# Patient Record
Sex: Female | Born: 1935 | Race: Black or African American | Hispanic: No | Smoking: Former smoker
Health system: Southern US, Community
[De-identification: ages and names within clinical notes are randomized; demographics above are authoritative.]

## PROBLEM LIST (undated history)

## (undated) DIAGNOSIS — K5792 Diverticulitis of intestine, part unspecified, without perforation or abscess without bleeding: Secondary | ICD-10-CM

## (undated) DIAGNOSIS — G459 Transient cerebral ischemic attack, unspecified: Secondary | ICD-10-CM

## (undated) DIAGNOSIS — C801 Malignant (primary) neoplasm, unspecified: Secondary | ICD-10-CM

## (undated) DIAGNOSIS — I10 Essential (primary) hypertension: Secondary | ICD-10-CM

## (undated) DIAGNOSIS — K56609 Unspecified intestinal obstruction, unspecified as to partial versus complete obstruction: Secondary | ICD-10-CM

## (undated) DIAGNOSIS — H409 Unspecified glaucoma: Secondary | ICD-10-CM

## (undated) HISTORY — PX: GLAUCOMA SURGERY: SHX656

## (undated) HISTORY — PX: ABDOMINAL HYSTERECTOMY: SHX81

---

## 2014-10-13 ENCOUNTER — Emergency Department (HOSPITAL_COMMUNITY): Payer: Medicare Other

## 2014-10-13 ENCOUNTER — Encounter (HOSPITAL_COMMUNITY): Payer: Self-pay | Admitting: Emergency Medicine

## 2014-10-13 ENCOUNTER — Inpatient Hospital Stay (HOSPITAL_COMMUNITY)
Admission: EM | Admit: 2014-10-13 | Discharge: 2014-10-16 | DRG: 389 | Disposition: A | Discharge status: Home Health | Payer: Medicare Other | Attending: Internal Medicine | Admitting: Internal Medicine

## 2014-10-13 ENCOUNTER — Other Ambulatory Visit (HOSPITAL_COMMUNITY): Payer: Self-pay

## 2014-10-13 DIAGNOSIS — Z8249 Family history of ischemic heart disease and other diseases of the circulatory system: Secondary | ICD-10-CM

## 2014-10-13 DIAGNOSIS — E870 Hyperosmolality and hypernatremia: Secondary | ICD-10-CM | POA: Diagnosis not present

## 2014-10-13 DIAGNOSIS — K5669 Other intestinal obstruction: Secondary | ICD-10-CM

## 2014-10-13 DIAGNOSIS — R110 Nausea: Secondary | ICD-10-CM | POA: Diagnosis present

## 2014-10-13 DIAGNOSIS — Z8673 Personal history of transient ischemic attack (TIA), and cerebral infarction without residual deficits: Secondary | ICD-10-CM

## 2014-10-13 DIAGNOSIS — H409 Unspecified glaucoma: Secondary | ICD-10-CM | POA: Diagnosis present

## 2014-10-13 DIAGNOSIS — K5660 Unspecified intestinal obstruction: Secondary | ICD-10-CM | POA: Diagnosis present

## 2014-10-13 DIAGNOSIS — R109 Unspecified abdominal pain: Secondary | ICD-10-CM

## 2014-10-13 DIAGNOSIS — K56609 Unspecified intestinal obstruction, unspecified as to partial versus complete obstruction: Secondary | ICD-10-CM | POA: Diagnosis present

## 2014-10-13 DIAGNOSIS — I10 Essential (primary) hypertension: Secondary | ICD-10-CM | POA: Diagnosis not present

## 2014-10-13 DIAGNOSIS — Z87891 Personal history of nicotine dependence: Secondary | ICD-10-CM

## 2014-10-13 DIAGNOSIS — R112 Nausea with vomiting, unspecified: Secondary | ICD-10-CM

## 2014-10-13 DIAGNOSIS — Z8542 Personal history of malignant neoplasm of other parts of uterus: Secondary | ICD-10-CM

## 2014-10-13 DIAGNOSIS — D72829 Elevated white blood cell count, unspecified: Secondary | ICD-10-CM | POA: Diagnosis present

## 2014-10-13 DIAGNOSIS — Z4659 Encounter for fitting and adjustment of other gastrointestinal appliance and device: Secondary | ICD-10-CM

## 2014-10-13 HISTORY — DX: Malignant (primary) neoplasm, unspecified: C80.1

## 2014-10-13 HISTORY — DX: Unspecified glaucoma: H40.9

## 2014-10-13 HISTORY — DX: Diverticulitis of intestine, part unspecified, without perforation or abscess without bleeding: K57.92

## 2014-10-13 HISTORY — DX: Unspecified intestinal obstruction, unspecified as to partial versus complete obstruction: K56.609

## 2014-10-13 HISTORY — DX: Transient cerebral ischemic attack, unspecified: G45.9

## 2014-10-13 HISTORY — DX: Essential (primary) hypertension: I10

## 2014-10-13 LAB — CBC WITH DIFFERENTIAL/PLATELET
Basophils Absolute: 0 10*3/uL (ref 0.0–0.1)
Basophils Relative: 0 % (ref 0–1)
EOS ABS: 0.1 10*3/uL (ref 0.0–0.7)
Eosinophils Relative: 1 % (ref 0–5)
HEMATOCRIT: 38.4 % (ref 36.0–46.0)
Hemoglobin: 12.5 g/dL (ref 12.0–15.0)
Lymphocytes Relative: 11 % — ABNORMAL LOW (ref 12–46)
Lymphs Abs: 0.7 10*3/uL (ref 0.7–4.0)
MCH: 30.3 pg (ref 26.0–34.0)
MCHC: 32.6 g/dL (ref 30.0–36.0)
MCV: 93 fL (ref 78.0–100.0)
Monocytes Absolute: 0.3 10*3/uL (ref 0.1–1.0)
Monocytes Relative: 5 % (ref 3–12)
NEUTROS ABS: 4.9 10*3/uL (ref 1.7–7.7)
NEUTROS PCT: 83 % — AB (ref 43–77)
Platelets: 183 10*3/uL (ref 150–400)
RBC: 4.13 MIL/uL (ref 3.87–5.11)
RDW: 12.8 % (ref 11.5–15.5)
WBC: 6 10*3/uL (ref 4.0–10.5)

## 2014-10-13 LAB — COMPREHENSIVE METABOLIC PANEL
ALT: 12 U/L (ref 0–35)
AST: 18 U/L (ref 0–37)
Albumin: 3.8 g/dL (ref 3.5–5.2)
Alkaline Phosphatase: 64 U/L (ref 39–117)
Anion gap: 9 (ref 5–15)
BILIRUBIN TOTAL: 0.7 mg/dL (ref 0.3–1.2)
BUN: 21 mg/dL (ref 6–23)
CALCIUM: 9.3 mg/dL (ref 8.4–10.5)
CO2: 31 mmol/L (ref 19–32)
CREATININE: 0.95 mg/dL (ref 0.50–1.10)
Chloride: 97 mmol/L (ref 96–112)
GFR, EST AFRICAN AMERICAN: 58 mL/min — AB (ref >90)
GFR, EST NON AFRICAN AMERICAN: 50 mL/min — AB (ref >90)
Glucose, Bld: 121 mg/dL — ABNORMAL HIGH (ref 70–99)
POTASSIUM: 3.5 mmol/L (ref 3.5–5.1)
Sodium: 137 mmol/L (ref 135–145)
TOTAL PROTEIN: 7.1 g/dL (ref 6.0–8.3)

## 2014-10-13 MED ORDER — LORAZEPAM 2 MG/ML IJ SOLN
0.5000 mg | Freq: Once | INTRAMUSCULAR | Status: AC
  Administered 2014-10-13: 0.5 mg via INTRAVENOUS
  Filled 2014-10-13: qty 1

## 2014-10-13 MED ORDER — ONDANSETRON HCL 4 MG/2ML IJ SOLN
4.0000 mg | Freq: Once | INTRAMUSCULAR | Status: AC
  Administered 2014-10-13: 4 mg via INTRAVENOUS
  Filled 2014-10-13: qty 2

## 2014-10-13 MED ORDER — FENTANYL CITRATE 0.05 MG/ML IJ SOLN
12.5000 ug | Freq: Once | INTRAMUSCULAR | Status: AC
  Administered 2014-10-13: 12.5 ug via INTRAVENOUS
  Filled 2014-10-13: qty 2

## 2014-10-13 MED ORDER — IOHEXOL 300 MG/ML  SOLN
25.0000 mL | Freq: Once | INTRAMUSCULAR | Status: AC | PRN
  Administered 2014-10-13: 25 mL via ORAL

## 2014-10-13 MED ORDER — SODIUM CHLORIDE 0.9 % IV SOLN
INTRAVENOUS | Status: AC
  Administered 2014-10-13: 17:00:00 via INTRAVENOUS

## 2014-10-13 MED ORDER — SODIUM CHLORIDE 0.9 % IV SOLN
INTRAVENOUS | Status: DC
  Administered 2014-10-14 – 2014-10-15 (×2): via INTRAVENOUS

## 2014-10-13 MED ORDER — MORPHINE SULFATE 2 MG/ML IJ SOLN: 1.0000 mg | INTRAMUSCULAR | Status: DC | PRN

## 2014-10-13 MED ORDER — FENTANYL CITRATE 0.05 MG/ML IJ SOLN
25.0000 ug | Freq: Once | INTRAMUSCULAR | Status: AC
  Administered 2014-10-13: 25 ug via INTRAVENOUS
  Filled 2014-10-13: qty 2

## 2014-10-13 MED ORDER — BISACODYL 10 MG RE SUPP: 10.0000 mg | Freq: Once | RECTAL | Status: DC

## 2014-10-13 MED ORDER — SODIUM CHLORIDE 0.9 % IV BOLUS (SEPSIS)
500.0000 mL | Freq: Once | INTRAVENOUS | Status: AC
  Administered 2014-10-13: 500 mL via INTRAVENOUS

## 2014-10-13 MED ORDER — ONDANSETRON HCL 4 MG PO TABS: 4.0000 mg | ORAL_TABLET | Freq: Four times a day (QID) | ORAL | Status: DC | PRN

## 2014-10-13 MED ORDER — SENNOSIDES-DOCUSATE SODIUM 8.6-50 MG PO TABS: 1.0000 | ORAL_TABLET | Freq: Every evening | ORAL | Status: DC | PRN

## 2014-10-13 MED ORDER — ONDANSETRON HCL 4 MG/2ML IJ SOLN
4.0000 mg | Freq: Four times a day (QID) | INTRAMUSCULAR | Status: DC | PRN
  Administered 2014-10-13: 4 mg via INTRAVENOUS
  Filled 2014-10-13: qty 2

## 2014-10-13 MED ORDER — HEPARIN SODIUM (PORCINE) 5000 UNIT/ML IJ SOLN
5000.0000 [IU] | Freq: Three times a day (TID) | INTRAMUSCULAR | Status: DC
  Administered 2014-10-13 – 2014-10-16 (×8): 5000 [IU] via SUBCUTANEOUS
  Filled 2014-10-13 (×7): qty 1

## 2014-10-13 MED ORDER — SODIUM CHLORIDE 0.9 % IJ SOLN
INTRAMUSCULAR | Status: AC
  Filled 2014-10-13: qty 250

## 2014-10-13 MED ORDER — SODIUM CHLORIDE 0.9 % IJ SOLN
INTRAMUSCULAR | Status: AC
  Filled 2014-10-13: qty 30

## 2014-10-13 MED ORDER — IOHEXOL 300 MG/ML  SOLN
100.0000 mL | Freq: Once | INTRAMUSCULAR | Status: AC | PRN
  Administered 2014-10-13: 100 mL via INTRAVENOUS

## 2014-10-13 MED ORDER — HYDRALAZINE HCL 20 MG/ML IJ SOLN: 10.0000 mg | Freq: Four times a day (QID) | INTRAMUSCULAR | Status: DC | PRN

## 2014-10-13 NOTE — ED Notes (Signed)
Patient requesting something for pain. EDP made aware-order to be given.

## 2014-10-13 NOTE — ED Notes (Signed)
Pt c/o abdominal pain and cough that began last night. Pt localizes pain to upper mid abd.

## 2014-10-13 NOTE — ED Notes (Signed)
Pt still in CT

## 2014-10-13 NOTE — ED Notes (Signed)
Pt unable to void at this time. 

## 2014-10-13 NOTE — ED Provider Notes (Signed)
CSN: [id number]     Arrival date & time 10/13/14  0714 History  This chart was scribed for Monetta* by Edison Simon, ED Scribe. This patient was seen in room A314/A314-01 and the patient's care was started at 7:40 AM.    Chief Complaint  Patient presents with  . Abdominal Pain   The history is provided by the patient and a relative. No language interpreter was used.    HPI Comments: Katie Benitez is a 81 y.o. female who presents to the Emergency Department complaining of nausea with onset last night. She states she has tried to vomit but cannot. She reports associated subjective fever. Relative notes she had a small liquid stool at 0300 this morning, which is not typical for her. She states she still has her gallbladder and appendix. She denies sick contacts. She denies dysuria, difficulty urinating, or constipation.  Past Medical History  Diagnosis Date  . Diverticulitis   . Hypertension   . TIA (transient ischemic attack)   . Glaucoma   . Cancer     uterine   Past Surgical History  Procedure Laterality Date  . Abdominal hysterectomy    . Glaucoma surgery     Family History  Problem Relation Age of Onset  . Hypertension Father   . Hypertension Sister   . Coronary artery disease Other    History  Substance Use Topics  . Smoking status: Former Research scientist (life sciences)  . Smokeless tobacco: Never Used  . Alcohol Use: No   OB History    Gravida Para Term Preterm AB TAB SAB Ectopic Multiple Living   6 6 6             Review of Systems  Constitutional: Positive for fever.  Respiratory: Positive for cough.   Gastrointestinal: Positive for nausea and diarrhea. Negative for vomiting and constipation.  Genitourinary: Negative for dysuria and difficulty urinating.  All other systems reviewed and are negative.     Allergies  Review of patient's allergies indicates no known allergies.  Home Medications   Prior to Admission medications   Medication Sig Start Date End Date  Taking? Authorizing Provider  amLODipine (NORVASC) 5 MG tablet Take 5 mg by mouth daily.   Yes Historical Provider, MD  atenolol (TENORMIN) 100 MG tablet Take 100 mg by mouth daily.   Yes Historical Provider, MD  Calcium Carb-Cholecalciferol 600-200 MG-UNIT TABS Take 1 tablet by mouth 2 (two) times daily.   Yes Historical Provider, MD  cloNIDine (CATAPRES) 0.1 MG tablet Take 0.1 mg by mouth 2 (two) times daily.   Yes Historical Provider, MD  docusate sodium (COLACE) 100 MG capsule Take 200 mg by mouth at bedtime.   Yes Historical Provider, MD  hydrochlorothiazide (HYDRODIURIL) 25 MG tablet Take 25 mg by mouth daily.   Yes Historical Provider, MD  quinapril (ACCUPRIL) 40 MG tablet Take 40 mg by mouth daily.   Yes Historical Provider, MD  tamsulosin (FLOMAX) 0.4 MG CAPS capsule Take 0.4 mg by mouth daily.   Yes Historical Provider, MD   BP 151/63 mmHg  Pulse 78  Temp(Src) 99.9 F (37.7 C) (Oral)  Resp 18  Ht 5\' 2"  (1.575 m)  Wt 143 lb 12.8 oz (65.227 kg)  BMI 26.29 kg/m2  SpO2 91% Physical Exam Physical Exam  Nursing note and vitals reviewed. Constitutional: She is oriented to person, place, and time. She appears well-developed and well-nourished. No distress.  HENT:  Head: Normocephalic and atraumatic.  Eyes: Pupils are equal, round, and  reactive to light.  Neck: Normal range of motion.  Cardiovascular: Normal rate and intact distal pulses.   Pulmonary/Chest: No respiratory distress.  Chest clear to auscultation  Abdominal: Normal appearance. She exhibits no distension.  No tenderness to palpation.  No rebound or guarding.   Musculoskeletal: Normal range of motion.  Neurological: She is alert and oriented to person, place, and time. No cranial nerve deficit.  Skin: Skin is warm and dry. No rash noted.  Psychiatric: She has a normal mood and affect. Her behavior is normal.   ED Course  Procedures (including critical care time)  DIAGNOSTIC STUDIES: Oxygen Saturation is 93% on room  air, adequate by my interpretation.    COORDINATION OF CARE: 7:44 AM Discussed treatment plan with patient at beside, the patient agrees with the plan and has no further questions at this time.   Labs Review Labs Reviewed  CBC WITH DIFFERENTIAL/PLATELET - Abnormal; Notable for the following:    Neutrophils Relative % 83 (*)    Lymphocytes Relative 11 (*)    All other components within normal limits  COMPREHENSIVE METABOLIC PANEL - Abnormal; Notable for the following:    Glucose, Bld 121 (*)    GFR calc non Af Amer 50 (*)    GFR calc Af Amer 58 (*)    All other components within normal limits  BASIC METABOLIC PANEL  CBC    Imaging Review Ct Abdomen Pelvis W Contrast  10/13/2014   CLINICAL DATA:  Nausea with onset last night. Abdominal pain, unspecified abdominal location.  EXAM: CT ABDOMEN AND PELVIS WITH CONTRAST  TECHNIQUE: Multidetector CT imaging of the abdomen and pelvis was performed using the standard protocol following bolus administration of intravenous contrast.  CONTRAST:  54mL OMNIPAQUE IOHEXOL 300 MG/ML SOLN, 166mL OMNIPAQUE IOHEXOL 300 MG/ML SOLN  COMPARISON:  Abdominal series [phone]  FINDINGS: Prominent lung markings at the lung bases suggestive for atelectasis or scarring. Negative for pleural effusions. No evidence for free intraperitoneal air.  Low-attenuation fluid in the subcarinal region could be related to the pericardial recess. Atherosclerotic calcifications in the coronary arteries. No significant pericardial effusion.  There is a trace amount of perihepatic fluid near the anterior dome. Gallbladder is mildly distended. Portal venous system is patent. Normal appearance of the spleen and pancreas. There is trace fluid along the left hemidiaphragm and adjacent to the spleen. Indeterminate 1.6 cm right adrenal nodule. Normal appearance the left adrenal gland. Mildly dense round structure along the right kidney upper pole (measures 1.5 cm) is indeterminate based on the  Hounsfield units (31). There are additional small hypodense structures throughout the right kidney that probably represent cysts. Probable small cysts in the left kidney. No evidence for hydronephrosis.  Uterus appears to be surgically absent. Moderate amount of fluid in the urinary bladder. Trace free fluid in the pelvis. Small amount of free fluid in the left paracolic gutter. There is free fluid and mesenteric edema in the right lower abdomen. Dilated loops of small bowel in the mid and right abdomen. The terminal ileum and appendix appear to be normal. Dilated small bowel loops measuring up to 2.7 cm.  No significant lymphadenopathy. Diffuse atherosclerotic calcifications involving the arterial structures. The abdominal aorta at the hiatus measures 3.1 cm. Large amount of mural thrombus along the right side of the infrarenal abdominal aorta. The infrarenal abdominal aorta is ectatic measuring up to 2.8 cm.  Severe multilevel degenerative disease in the lumbar spine. Anterolisthesis at L4-L5.  IMPRESSION: Dilated loops of small bowel  with abdominal ascites. The distal small bowel is decompressed. Findings are suggestive for a small bowel obstruction. A transition point is not clearly identified.  Several low density structures in the kidneys are suggestive for cysts. Largest in the right kidney upper pole is indeterminate based on the Hounsfield units.  1.6 cm right adrenal nodule is indeterminate. Favor a benign etiology based on the size.  Diffuse atherosclerotic disease involving the aorta and coronary arteries.  These results were called by telephone at the time of interpretation on 10/13/2014 at 11:54 am to Dr. Leonard Schwartz , who verbally acknowledged these results.   Electronically Signed   By: Markus Daft M.D.   On: 10/13/2014 11:55   Dg Abd Acute W/chest  10/13/2014   CLINICAL DATA:  Nausea and vomiting  EXAM: ACUTE ABDOMEN SERIES (ABDOMEN 2 VIEW & CHEST 1 VIEW)  COMPARISON:  None  FINDINGS: PA chest:  No edema or consolidation. Heart is upper normal in size with pulmonary vascularity within normal limits. No adenopathy. There is degenerative change in the thoracic spine.  Supine and upright abdomen: There is moderate stool in the colon. There is no bowel dilatation or air-fluid level suggesting obstruction. No free air. There are surgical clips in the you lower abdomen and pelvis. There is degenerative change in the lumbar spine.  IMPRESSION: Overall bowel gas pattern unremarkable. No demonstrable obstruction or free air. No edema or consolidation in the lungs.   Electronically Signed   By: Lowella Grip III M.D.   On: 10/13/2014 08:38     EKG Interpretation   Date/Time:  Saturday October 13 2014 08:35:20 EDT Ventricular Rate:  52 PR Interval:  193 QRS Duration: 139 QT Interval:  461 QTC Calculation: 429 R Axis:   10 Text Interpretation:  Sinus rhythm Multiple premature complexes, vent   Probable left atrial enlargement Left bundle branch block Confirmed by  Cleburn Maiolo  MD, Chayil Gantt ([id number]) on 10/13/2014 8:38:47 AM     Surgery consulted, recommedned NG tube.  Will admit to medicine. MDM   Final diagnoses:  Nausea and vomiting  Abdominal pain  Small bowel obstruction    I personally performed the services described in this documentation, which was scribed in my presence. The recorded information has been reviewed and considered.   Leonard Schwartz, MD 10/14/14 [phone]

## 2014-10-13 NOTE — ED Notes (Signed)
Attempted to call report, floor to call back

## 2014-10-13 NOTE — ED Notes (Signed)
Dr. Arnoldo Morale called to consult for surgery.

## 2014-10-13 NOTE — H&P (Signed)
[facility]          History and Physical    PCP:   ROBERTSON, ANTHONY T, PA-C   Chief Complaint:  Abdominal pain, nausea, vomiting  HPI: Patient is a 81 year old woman with history significant for hypertension. She states that around 3:00 this morning she started having severe abdominal pain and vomited 3 times yellow color, no blood. She describes abdominal pain as generalized. She came into the hospital where CT scan was performed and showed a small bowel obstruction without clear transition point, however distal small bowel appear to be decompressed. Surgery, Dr. Arnoldo Morale was consulted by EDP with recommendations for NG tube placement and medical admission with hopes of conservative treatment only. Patient has been admitted for further evaluation and management.  Allergies:  No Known Allergies    Past Medical History  Diagnosis Date  . Diverticulitis   . Hypertension   . TIA (transient ischemic attack)   . Glaucoma   . Cancer     uterine    Past Surgical History  Procedure Laterality Date  . Abdominal hysterectomy    . Glaucoma surgery      Prior to Admission medications   Medication Sig Start Date End Date Taking? Authorizing Provider  amLODipine (NORVASC) 5 MG tablet Take 5 mg by mouth daily.   Yes Historical Provider, MD  atenolol (TENORMIN) 100 MG tablet Take 100 mg by mouth daily.   Yes Historical Provider, MD  Calcium Carb-Cholecalciferol 600-200 MG-UNIT TABS Take 1 tablet by mouth 2 (two) times daily.   Yes Historical Provider, MD  cloNIDine (CATAPRES) 0.1 MG tablet Take 0.1 mg by mouth 2 (two) times daily.   Yes Historical Provider, MD  docusate sodium (COLACE) 100 MG capsule Take 200 mg by mouth at bedtime.   Yes Historical Provider, MD  hydrochlorothiazide (HYDRODIURIL) 25 MG tablet Take 25 mg by mouth daily.   Yes Historical Provider, MD  quinapril (ACCUPRIL) 40 MG tablet Take 40 mg by mouth daily.   Yes Historical Provider, MD    tamsulosin (FLOMAX) 0.4 MG CAPS capsule Take 0.4 mg by mouth daily.   Yes Historical Provider, MD    Social History:  reports that she has quit smoking. She has never used smokeless tobacco. She reports that she does not drink alcohol or use illicit drugs.  Family History  Problem Relation Age of Onset  . Hypertension Father   . Hypertension Sister   . Coronary artery disease Other     Review of Systems:  Constitutional: Denies fever, chills, diaphoresis, appetite change and fatigue.  HEENT: Denies photophobia, eye pain, redness, hearing loss, ear pain, congestion, sore throat, rhinorrhea, sneezing, mouth sores, trouble swallowing, neck pain, neck stiffness and tinnitus.   Respiratory: Denies SOB, DOE, cough, chest tightness,  and wheezing.   Cardiovascular: Denies chest pain, palpitations and leg swelling.  Gastrointestinal: Denies  blood in stool Genitourinary: Denies dysuria, urgency, frequency, hematuria, flank pain and difficulty urinating.  Endocrine: Denies: hot or cold intolerance, sweats, changes in hair or nails, polyuria, polydipsia. Musculoskeletal: Denies myalgias, back pain, joint swelling, arthralgias and gait problem.  Skin: Denies pallor, rash and wound.  Neurological: Denies dizziness, seizures, syncope, weakness, light-headedness, numbness and headaches.  Hematological: Denies adenopathy. Easy bruising, personal or family bleeding history  Psychiatric/Behavioral: Denies suicidal ideation, mood changes, confusion, nervousness, sleep disturbance and agitation   Physical Exam: Blood pressure 158/48, pulse 68, temperature 99.2 F (37.3  C), temperature source Oral, resp. rate 18, height '5\' 2"'  (1.575 m), weight 66.407 kg (146 lb 6.4 oz), SpO2 97 %. General: Alert, awake, HEENT: Normocephalic, atraumatic, pupils equal round and reactive to light, extraocular movements intact, NG in place, poor dentition, dry mucous membranes. Neck: Supple, no JVD, no lymphadenopathy, no  bruits, no goiter. Cardiovascular: Regular rate and rhythm, no murmurs, rubs or gallops. Lungs: Clear to auscultation bilaterally. Abdomen: Soft, diffusely tender to palpation, nondistended, hypoactive bowel sounds, no masses or organomegaly noted. Sign extremities: No clubbing, cyanosis or edema, positive pulses. Neurologic: Appears grossly intact and nonfocal, I have not ambulated her.  Labs on Admission:  Results for orders placed or performed during the hospital encounter of 10/13/14 (from the past 48 hour(s))  CBC with Differential/Platelet     Status: Abnormal   Collection Time: 10/13/14  8:00 AM  Result Value Ref Range   WBC 6.0 4.0 - 10.5 K/uL   RBC 4.13 3.87 - 5.11 MIL/uL   Hemoglobin 12.5 12.0 - 15.0 g/dL   HCT 38.4 36.0 - 46.0 %   MCV 93.0 78.0 - 100.0 fL   MCH 30.3 26.0 - 34.0 pg   MCHC 32.6 30.0 - 36.0 g/dL   RDW 12.8 11.5 - 15.5 %   Platelets 183 150 - 400 K/uL   Neutrophils Relative % 83 (H) 43 - 77 %   Neutro Abs 4.9 1.7 - 7.7 K/uL   Lymphocytes Relative 11 (L) 12 - 46 %   Lymphs Abs 0.7 0.7 - 4.0 K/uL   Monocytes Relative 5 3 - 12 %   Monocytes Absolute 0.3 0.1 - 1.0 K/uL   Eosinophils Relative 1 0 - 5 %   Eosinophils Absolute 0.1 0.0 - 0.7 K/uL   Basophils Relative 0 0 - 1 %   Basophils Absolute 0.0 0.0 - 0.1 K/uL  Comprehensive metabolic panel     Status: Abnormal   Collection Time: 10/13/14  8:00 AM  Result Value Ref Range   Sodium 137 135 - 145 mmol/L   Potassium 3.5 3.5 - 5.1 mmol/L   Chloride 97 96 - 112 mmol/L   CO2 31 19 - 32 mmol/L   Glucose, Bld 121 (H) 70 - 99 mg/dL   BUN 21 6 - 23 mg/dL   Creatinine, Ser 0.95 0.50 - 1.10 mg/dL   Calcium 9.3 8.4 - 10.5 mg/dL   Total Protein 7.1 6.0 - 8.3 g/dL   Albumin 3.8 3.5 - 5.2 g/dL   AST 18 0 - 37 U/L   ALT 12 0 - 35 U/L   Alkaline Phosphatase 64 39 - 117 U/L   Total Bilirubin 0.7 0.3 - 1.2 mg/dL   GFR calc non Af Amer 50 (L) >90 mL/min   GFR calc Af Amer 58 (L) >90 mL/min    Comment: (NOTE) The eGFR  has been calculated using the CKD EPI equation. This calculation has not been validated in all clinical situations. eGFR's persistently <90 mL/min signify possible Chronic Kidney Disease.    Anion gap 9 5 - 15    Radiological Exams on Admission: Ct Abdomen Pelvis W Contrast  10/13/2014   CLINICAL DATA:  Nausea with onset last night. Abdominal pain, unspecified abdominal location.  EXAM: CT ABDOMEN AND PELVIS WITH CONTRAST  TECHNIQUE: Multidetector CT imaging of the abdomen and pelvis was performed using the standard protocol following bolus administration of intravenous contrast.  CONTRAST:  75m OMNIPAQUE IOHEXOL 300 MG/ML SOLN, 1018mOMNIPAQUE IOHEXOL 300 MG/ML SOLN  COMPARISON:  Abdominal series [id number]  FINDINGS: Prominent lung markings at the lung bases suggestive for atelectasis or scarring. Negative for pleural effusions. No evidence for free intraperitoneal air.  Low-attenuation fluid in the subcarinal region could be related to the pericardial recess. Atherosclerotic calcifications in the coronary arteries. No significant pericardial effusion.  There is a trace amount of perihepatic fluid near the anterior dome. Gallbladder is mildly distended. Portal venous system is patent. Normal appearance of the spleen and pancreas. There is trace fluid along the left hemidiaphragm and adjacent to the spleen. Indeterminate 1.6 cm right adrenal nodule. Normal appearance the left adrenal gland. Mildly dense round structure along the right kidney upper pole (measures 1.5 cm) is indeterminate based on the Hounsfield units (31). There are additional small hypodense structures throughout the right kidney that probably represent cysts. Probable small cysts in the left kidney. No evidence for hydronephrosis.  Uterus appears to be surgically absent. Moderate amount of fluid in the urinary bladder. Trace free fluid in the pelvis. Small amount of free fluid in the left paracolic gutter. There is free fluid and mesenteric  edema in the right lower abdomen. Dilated loops of small bowel in the mid and right abdomen. The terminal ileum and appendix appear to be normal. Dilated small bowel loops measuring up to 2.7 cm.  No significant lymphadenopathy. Diffuse atherosclerotic calcifications involving the arterial structures. The abdominal aorta at the hiatus measures 3.1 cm. Large amount of mural thrombus along the right side of the infrarenal abdominal aorta. The infrarenal abdominal aorta is ectatic measuring up to 2.8 cm.  Severe multilevel degenerative disease in the lumbar spine. Anterolisthesis at L4-L5.  IMPRESSION: Dilated loops of small bowel with abdominal ascites. The distal small bowel is decompressed. Findings are suggestive for a small bowel obstruction. A transition point is not clearly identified.  Several low density structures in the kidneys are suggestive for cysts. Largest in the right kidney upper pole is indeterminate based on the Hounsfield units.  1.6 cm right adrenal nodule is indeterminate. Favor a benign etiology based on the size.  Diffuse atherosclerotic disease involving the aorta and coronary arteries.  These results were called by telephone at the time of interpretation on 10/13/2014 at 11:54 am to Dr. Leonard Schwartz , who verbally acknowledged these results.   Electronically Signed   By: Markus Daft M.D.   On: 10/13/2014 11:55   Dg Abd Acute W/chest  10/13/2014   CLINICAL DATA:  Nausea and vomiting  EXAM: ACUTE ABDOMEN SERIES (ABDOMEN 2 VIEW & CHEST 1 VIEW)  COMPARISON:  None  FINDINGS: PA chest: No edema or consolidation. Heart is upper normal in size with pulmonary vascularity within normal limits. No adenopathy. There is degenerative change in the thoracic spine.  Supine and upright abdomen: There is moderate stool in the colon. There is no bowel dilatation or air-fluid level suggesting obstruction. No free air. There are surgical clips in the you lower abdomen and pelvis. There is degenerative change in  the lumbar spine.  IMPRESSION: Overall bowel gas pattern unremarkable. No demonstrable obstruction or free air. No edema or consolidation in the lungs.   Electronically Signed   By: Lowella Grip III M.D.   On: 10/13/2014 08:38    Assessment/Plan Principal Problem:   Small bowel obstruction Active Problems:   HTN (hypertension)   SBO (small bowel obstruction)    Small bowel obstruction -Continue NG to intermittent suction. -Nothing by mouth status. -Recheck abdominal film in the morning.- -Surgery to see patient in  the morning, hope to resolve small bowel obstruction with conservative measures.  Hypertension -Hold all by mouth meds given nothing by mouth state. -When necessary hydralazine as needed.  DVT prophylaxis -Subcutaneous heparin  CODE STATUS -Full code as discussed with patient and daughter Ralph Leyden at bedside.   Time Spent on Admission: 85 minutes  HERNANDEZ Benitez,Katie [facility] Pager: [phone] 10/13/2014, 5:16 PM

## 2014-10-14 ENCOUNTER — Inpatient Hospital Stay (HOSPITAL_COMMUNITY): Payer: Medicare Other

## 2014-10-14 LAB — CBC
HCT: 39.8 % (ref 36.0–46.0)
HEMOGLOBIN: 12.8 g/dL (ref 12.0–15.0)
MCH: 30 pg (ref 26.0–34.0)
MCHC: 32.2 g/dL (ref 30.0–36.0)
MCV: 93.2 fL (ref 78.0–100.0)
PLATELETS: 187 10*3/uL (ref 150–400)
RBC: 4.27 MIL/uL (ref 3.87–5.11)
RDW: 13.2 % (ref 11.5–15.5)
WBC: 11.5 10*3/uL — AB (ref 4.0–10.5)

## 2014-10-14 LAB — BASIC METABOLIC PANEL
Anion gap: 8 (ref 5–15)
BUN: 24 mg/dL — AB (ref 6–23)
CO2: 33 mmol/L — AB (ref 19–32)
Calcium: 8.4 mg/dL (ref 8.4–10.5)
Chloride: 102 mmol/L (ref 96–112)
Creatinine, Ser: 0.96 mg/dL (ref 0.50–1.10)
GFR calc Af Amer: 57 mL/min — ABNORMAL LOW (ref >90)
GFR, EST NON AFRICAN AMERICAN: 49 mL/min — AB (ref >90)
Glucose, Bld: 122 mg/dL — ABNORMAL HIGH (ref 70–99)
Potassium: 3.5 mmol/L (ref 3.5–5.1)
Sodium: 143 mmol/L (ref 135–145)

## 2014-10-14 NOTE — Progress Notes (Signed)
[facility] PROGRESS NOTE  Jeanne Diefendorf OZH:[id number] DOB: Mar 11, 1919 DOA: 10/13/2014 PCP: Bronson Curb, PA-C  Assessment/Plan: Partial SBO -Continue NG decompression. -Abd film today shows contrast passing into the colon. -Appreciate surgical input.  HTN -BP meds on hold given NPO state.  Code Status: Full Code Family Communication: daughter  Disposition Plan: To be determined   Consultants:  Surgery   Antibiotics:  None   Subjective: Still with mild abdominal pain  Objective: Filed Vitals:   10/13/14 1602 10/13/14 1611 10/13/14 2253 10/14/14 0551  BP: 158/48  136/49 151/63  Pulse: 68  74 78  Temp: 99.2 F (37.3 C)  99 F (37.2 C) 99.9 F (37.7 C)  TempSrc: Oral  Oral Oral  Resp: 18  18 18   Height:  5\' 2"  (1.575 m)    Weight:  66.407 kg (146 lb 6.4 oz)  65.227 kg (143 lb 12.8 oz)  SpO2: 97%  96% 91%    Intake/Output Summary (Last 24 hours) at 10/14/14 1345 Last data filed at 10/14/14 0600  Gross per 24 hour  Intake      0 ml  Output    800 ml  Net   -800 ml   Filed Weights   10/13/14 0726 10/13/14 1611 10/14/14 0551  Weight: 65.772 kg (145 lb) 66.407 kg (146 lb 6.4 oz) 65.227 kg (143 lb 12.8 oz)    Exam:   General:  AA Ox3  Cardiovascular: RRR  Respiratory: CTA B  Abdomen: S/NT/ND/+BS  Extremities: no C/C/E   Neurologic:  Non-focal  Data Reviewed: Basic Metabolic Panel:  Recent Labs Lab 10/13/14 0800 10/14/14 0654  NA 137 143  K 3.5 3.5  CL 97 102  CO2 31 33*  GLUCOSE 121* 122*  BUN 21 24*  CREATININE 0.95 0.96  CALCIUM 9.3 8.4   Liver Function Tests:  Recent Labs Lab 10/13/14 0800  AST 18  ALT 12  ALKPHOS 64  BILITOT 0.7  PROT 7.1  ALBUMIN 3.8   No results for input(s): LIPASE, AMYLASE in the last 168 hours. No results for input(s): AMMONIA in the last 168 hours. CBC:  Recent Labs Lab 10/13/14 0800 10/14/14 0654  WBC 6.0 11.5*  NEUTROABS 4.9  --   HGB 12.5 12.8  HCT 38.4 39.8    MCV 93.0 93.2  PLT 183 187   Cardiac Enzymes: No results for input(s): CKTOTAL, CKMB, CKMBINDEX, TROPONINI in the last 168 hours. BNP (last 3 results) No results for input(s): BNP in the last 8760 hours.  ProBNP (last 3 results) No results for input(s): PROBNP in the last 8760 hours.  CBG: No results for input(s): GLUCAP in the last 168 hours.  No results found for this or any previous visit (from the past 240 hour(s)).   Studies: Dg Abd 1 View  10/14/2014   CLINICAL DATA:  Small bowel obstruction with nausea and weakness  EXAM: ABDOMEN - 1 VIEW  COMPARISON:  CT abdomen and pelvis October 13, 2014  FINDINGS: Nasogastric tube tip and side port are in the stomach. There remain loops of mildly dilated small bowel in the mid abdomen. Contrast is seen in the colon. No free air is seen on this supine examination. There is contrast within the urinary bladder. There is extensive degenerative type change throughout the lumbar spine.  IMPRESSION: Nasogastric tube tip and side port in stomach. Loops of dilated small bowel are noted. Contrast does reach colon. A degree of incomplete small bowel obstruction may well remain.  Electronically Signed   By: Lowella Grip III M.D.   On: 10/14/2014 09:22   Ct Abdomen Pelvis W Contrast  10/13/2014   CLINICAL DATA:  Nausea with onset last night. Abdominal pain, unspecified abdominal location.  EXAM: CT ABDOMEN AND PELVIS WITH CONTRAST  TECHNIQUE: Multidetector CT imaging of the abdomen and pelvis was performed using the standard protocol following bolus administration of intravenous contrast.  CONTRAST:  66mL OMNIPAQUE IOHEXOL 300 MG/ML SOLN, 185mL OMNIPAQUE IOHEXOL 300 MG/ML SOLN  COMPARISON:  Abdominal series [id number]  FINDINGS: Prominent lung markings at the lung bases suggestive for atelectasis or scarring. Negative for pleural effusions. No evidence for free intraperitoneal air.  Low-attenuation fluid in the subcarinal region could be related to the  pericardial recess. Atherosclerotic calcifications in the coronary arteries. No significant pericardial effusion.  There is a trace amount of perihepatic fluid near the anterior dome. Gallbladder is mildly distended. Portal venous system is patent. Normal appearance of the spleen and pancreas. There is trace fluid along the left hemidiaphragm and adjacent to the spleen. Indeterminate 1.6 cm right adrenal nodule. Normal appearance the left adrenal gland. Mildly dense round structure along the right kidney upper pole (measures 1.5 cm) is indeterminate based on the Hounsfield units (31). There are additional small hypodense structures throughout the right kidney that probably represent cysts. Probable small cysts in the left kidney. No evidence for hydronephrosis.  Uterus appears to be surgically absent. Moderate amount of fluid in the urinary bladder. Trace free fluid in the pelvis. Small amount of free fluid in the left paracolic gutter. There is free fluid and mesenteric edema in the right lower abdomen. Dilated loops of small bowel in the mid and right abdomen. The terminal ileum and appendix appear to be normal. Dilated small bowel loops measuring up to 2.7 cm.  No significant lymphadenopathy. Diffuse atherosclerotic calcifications involving the arterial structures. The abdominal aorta at the hiatus measures 3.1 cm. Large amount of mural thrombus along the right side of the infrarenal abdominal aorta. The infrarenal abdominal aorta is ectatic measuring up to 2.8 cm.  Severe multilevel degenerative disease in the lumbar spine. Anterolisthesis at L4-L5.  IMPRESSION: Dilated loops of small bowel with abdominal ascites. The distal small bowel is decompressed. Findings are suggestive for a small bowel obstruction. A transition point is not clearly identified.  Several low density structures in the kidneys are suggestive for cysts. Largest in the right kidney upper pole is indeterminate based on the Hounsfield units.   1.6 cm right adrenal nodule is indeterminate. Favor a benign etiology based on the size.  Diffuse atherosclerotic disease involving the aorta and coronary arteries.  These results were called by telephone at the time of interpretation on 10/13/2014 at 11:54 am to Dr. Leonard Schwartz , who verbally acknowledged these results.   Electronically Signed   By: Markus Daft M.D.   On: 10/13/2014 11:55   Dg Chest Port 1 View  10/14/2014   CLINICAL DATA:  Nausea and weakness.  Small bowel obstruction.  EXAM: PORTABLE CHEST - 1 VIEW  COMPARISON:  October 13, 2014  FINDINGS: Nasogastric tube tip and side port are in the stomach. There is new airspace consolidation in the right lung base. There is mild atelectasis in the left base. Lungs elsewhere clear. Heart is slightly prominent with pulmonary vascularity within normal limits. No adenopathy.  IMPRESSION: New patchy infiltrate right base. Mild left base atelectasis. No change in cardiac silhouette. Nasogastric tube tip and side port in stomach.  Electronically Signed   By: Lowella Grip III M.D.   On: 10/14/2014 07:39   Dg Abd Acute W/chest  10/13/2014   CLINICAL DATA:  Nausea and vomiting  EXAM: ACUTE ABDOMEN SERIES (ABDOMEN 2 VIEW & CHEST 1 VIEW)  COMPARISON:  None  FINDINGS: PA chest: No edema or consolidation. Heart is upper normal in size with pulmonary vascularity within normal limits. No adenopathy. There is degenerative change in the thoracic spine.  Supine and upright abdomen: There is moderate stool in the colon. There is no bowel dilatation or air-fluid level suggesting obstruction. No free air. There are surgical clips in the you lower abdomen and pelvis. There is degenerative change in the lumbar spine.  IMPRESSION: Overall bowel gas pattern unremarkable. No demonstrable obstruction or free air. No edema or consolidation in the lungs.   Electronically Signed   By: Lowella Grip III M.D.   On: 10/13/2014 08:38    Scheduled Meds: . heparin  5,000 Units  Subcutaneous 3 times per day   Continuous Infusions: . sodium chloride      Principal Problem:   Small bowel obstruction Active Problems:   HTN (hypertension)   SBO (small bowel obstruction)    Time spent: 25 minutes. Greater than 50% of this time was spent in direct contact with the patient coordinating care.    Lelon Frohlich  [facility] Pager [phone]  If 7PM-7AM, please contact night-coverage at [email or web address], password [facility] 10/14/2014, 1:45 PM  LOS: 1 day

## 2014-10-14 NOTE — Consult Note (Signed)
Reason for Consult: Small bowel obstruction Referring Physician: Hospitalist  Jamiee Milholland is an 81 y.o. female.  HPI: Patient is a 81 year old black female who presented to emergency room with worsening nausea and vomiting. CT scan of the abdomen revealed dilated small bowel with some decompression of the bowel distally. No transition zone was seen. There was some free fluid within the abdomen. Patient is not the best historian. She has had a hysterectomy in the remote past. She states she feels better.  Past Medical History  Diagnosis Date  . Diverticulitis   . Hypertension   . TIA (transient ischemic attack)   . Glaucoma   . Cancer     uterine    Past Surgical History  Procedure Laterality Date  . Abdominal hysterectomy    . Glaucoma surgery      Family History  Problem Relation Age of Onset  . Hypertension Father   . Hypertension Sister   . Coronary artery disease Other     Social History:  reports that she has quit smoking. She has never used smokeless tobacco. She reports that she does not drink alcohol or use illicit drugs.  Allergies: No Known Allergies  Medications: I have reviewed the patient's current medications.  Results for orders placed or performed during the hospital encounter of 10/13/14 (from the past 48 hour(s))  CBC with Differential/Platelet     Status: Abnormal   Collection Time: 10/13/14  8:00 AM  Result Value Ref Range   WBC 6.0 4.0 - 10.5 K/uL   RBC 4.13 3.87 - 5.11 MIL/uL   Hemoglobin 12.5 12.0 - 15.0 g/dL   HCT 38.4 36.0 - 46.0 %   MCV 93.0 78.0 - 100.0 fL   MCH 30.3 26.0 - 34.0 pg   MCHC 32.6 30.0 - 36.0 g/dL   RDW 12.8 11.5 - 15.5 %   Platelets 183 150 - 400 K/uL   Neutrophils Relative % 83 (H) 43 - 77 %   Neutro Abs 4.9 1.7 - 7.7 K/uL   Lymphocytes Relative 11 (L) 12 - 46 %   Lymphs Abs 0.7 0.7 - 4.0 K/uL   Monocytes Relative 5 3 - 12 %   Monocytes Absolute 0.3 0.1 - 1.0 K/uL   Eosinophils Relative 1 0 - 5 %   Eosinophils  Absolute 0.1 0.0 - 0.7 K/uL   Basophils Relative 0 0 - 1 %   Basophils Absolute 0.0 0.0 - 0.1 K/uL  Comprehensive metabolic panel     Status: Abnormal   Collection Time: 10/13/14  8:00 AM  Result Value Ref Range   Sodium 137 135 - 145 mmol/L   Potassium 3.5 3.5 - 5.1 mmol/L   Chloride 97 96 - 112 mmol/L   CO2 31 19 - 32 mmol/L   Glucose, Bld 121 (H) 70 - 99 mg/dL   BUN 21 6 - 23 mg/dL   Creatinine, Ser 0.95 0.50 - 1.10 mg/dL   Calcium 9.3 8.4 - 10.5 mg/dL   Total Protein 7.1 6.0 - 8.3 g/dL   Albumin 3.8 3.5 - 5.2 g/dL   AST 18 0 - 37 U/L   ALT 12 0 - 35 U/L   Alkaline Phosphatase 64 39 - 117 U/L   Total Bilirubin 0.7 0.3 - 1.2 mg/dL   GFR calc non Af Amer 50 (L) >90 mL/min   GFR calc Af Amer 58 (L) >90 mL/min    Comment: (NOTE) The eGFR has been calculated using the CKD EPI equation. This calculation has not  been validated in all clinical situations. eGFR's persistently <90 mL/min signify possible Chronic Kidney Disease.    Anion gap 9 5 - 15  Basic metabolic panel     Status: Abnormal   Collection Time: 10/14/14  6:54 AM  Result Value Ref Range   Sodium 143 135 - 145 mmol/L   Potassium 3.5 3.5 - 5.1 mmol/L   Chloride 102 96 - 112 mmol/L   CO2 33 (H) 19 - 32 mmol/L   Glucose, Bld 122 (H) 70 - 99 mg/dL   BUN 24 (H) 6 - 23 mg/dL   Creatinine, Ser 0.96 0.50 - 1.10 mg/dL   Calcium 8.4 8.4 - 10.5 mg/dL   GFR calc non Af Amer 49 (L) >90 mL/min   GFR calc Af Amer 57 (L) >90 mL/min    Comment: (NOTE) The eGFR has been calculated using the CKD EPI equation. This calculation has not been validated in all clinical situations. eGFR's persistently <90 mL/min signify possible Chronic Kidney Disease.    Anion gap 8 5 - 15  CBC     Status: Abnormal   Collection Time: 10/14/14  6:54 AM  Result Value Ref Range   WBC 11.5 (H) 4.0 - 10.5 K/uL   RBC 4.27 3.87 - 5.11 MIL/uL   Hemoglobin 12.8 12.0 - 15.0 g/dL   HCT 39.8 36.0 - 46.0 %   MCV 93.2 78.0 - 100.0 fL   MCH 30.0 26.0 - 34.0  pg   MCHC 32.2 30.0 - 36.0 g/dL   RDW 13.2 11.5 - 15.5 %   Platelets 187 150 - 400 K/uL    Dg Abd 1 View  10/14/2014   CLINICAL DATA:  Small bowel obstruction with nausea and weakness  EXAM: ABDOMEN - 1 VIEW  COMPARISON:  CT abdomen and pelvis October 13, 2014  FINDINGS: Nasogastric tube tip and side port are in the stomach. There remain loops of mildly dilated small bowel in the mid abdomen. Contrast is seen in the colon. No free air is seen on this supine examination. There is contrast within the urinary bladder. There is extensive degenerative type change throughout the lumbar spine.  IMPRESSION: Nasogastric tube tip and side port in stomach. Loops of dilated small bowel are noted. Contrast does reach colon. A degree of incomplete small bowel obstruction may well remain.   Electronically Signed   By: Lowella Grip III M.D.   On: 10/14/2014 09:22   Ct Abdomen Pelvis W Contrast  10/13/2014   CLINICAL DATA:  Nausea with onset last night. Abdominal pain, unspecified abdominal location.  EXAM: CT ABDOMEN AND PELVIS WITH CONTRAST  TECHNIQUE: Multidetector CT imaging of the abdomen and pelvis was performed using the standard protocol following bolus administration of intravenous contrast.  CONTRAST:  8m OMNIPAQUE IOHEXOL 300 MG/ML SOLN, 103mOMNIPAQUE IOHEXOL 300 MG/ML SOLN  COMPARISON:  Abdominal series [id number]: Prominent lung markings at the lung bases suggestive for atelectasis or scarring. Negative for pleural effusions. No evidence for free intraperitoneal air.  Low-attenuation fluid in the subcarinal region could be related to the pericardial recess. Atherosclerotic calcifications in the coronary arteries. No significant pericardial effusion.  There is a trace amount of perihepatic fluid near the anterior dome. Gallbladder is mildly distended. Portal venous system is patent. Normal appearance of the spleen and pancreas. There is trace fluid along the left hemidiaphragm and adjacent to the  spleen. Indeterminate 1.6 cm right adrenal nodule. Normal appearance the left adrenal gland. Mildly dense round structure along the  right kidney upper pole (measures 1.5 cm) is indeterminate based on the Hounsfield units (31). There are additional small hypodense structures throughout the right kidney that probably represent cysts. Probable small cysts in the left kidney. No evidence for hydronephrosis.  Uterus appears to be surgically absent. Moderate amount of fluid in the urinary bladder. Trace free fluid in the pelvis. Small amount of free fluid in the left paracolic gutter. There is free fluid and mesenteric edema in the right lower abdomen. Dilated loops of small bowel in the mid and right abdomen. The terminal ileum and appendix appear to be normal. Dilated small bowel loops measuring up to 2.7 cm.  No significant lymphadenopathy. Diffuse atherosclerotic calcifications involving the arterial structures. The abdominal aorta at the hiatus measures 3.1 cm. Large amount of mural thrombus along the right side of the infrarenal abdominal aorta. The infrarenal abdominal aorta is ectatic measuring up to 2.8 cm.  Severe multilevel degenerative disease in the lumbar spine. Anterolisthesis at L4-L5.  IMPRESSION: Dilated loops of small bowel with abdominal ascites. The distal small bowel is decompressed. Findings are suggestive for a small bowel obstruction. A transition point is not clearly identified.  Several low density structures in the kidneys are suggestive for cysts. Largest in the right kidney upper pole is indeterminate based on the Hounsfield units.  1.6 cm right adrenal nodule is indeterminate. Favor a benign etiology based on the size.  Diffuse atherosclerotic disease involving the aorta and coronary arteries.  These results were called by telephone at the time of interpretation on 10/13/2014 at 11:54 am to Dr. Leonard Schwartz , who verbally acknowledged these results.   Electronically Signed   By: Markus Daft  M.D.   On: 10/13/2014 11:55   Dg Chest Port 1 View  10/14/2014   CLINICAL DATA:  Nausea and weakness.  Small bowel obstruction.  EXAM: PORTABLE CHEST - 1 VIEW  COMPARISON:  October 13, 2014  FINDINGS: Nasogastric tube tip and side port are in the stomach. There is new airspace consolidation in the right lung base. There is mild atelectasis in the left base. Lungs elsewhere clear. Heart is slightly prominent with pulmonary vascularity within normal limits. No adenopathy.  IMPRESSION: New patchy infiltrate right base. Mild left base atelectasis. No change in cardiac silhouette. Nasogastric tube tip and side port in stomach.   Electronically Signed   By: Lowella Grip III M.D.   On: 10/14/2014 07:39   Dg Abd Acute W/chest  10/13/2014   CLINICAL DATA:  Nausea and vomiting  EXAM: ACUTE ABDOMEN SERIES (ABDOMEN 2 VIEW & CHEST 1 VIEW)  COMPARISON:  None  FINDINGS: PA chest: No edema or consolidation. Heart is upper normal in size with pulmonary vascularity within normal limits. No adenopathy. There is degenerative change in the thoracic spine.  Supine and upright abdomen: There is moderate stool in the colon. There is no bowel dilatation or air-fluid level suggesting obstruction. No free air. There are surgical clips in the you lower abdomen and pelvis. There is degenerative change in the lumbar spine.  IMPRESSION: Overall bowel gas pattern unremarkable. No demonstrable obstruction or free air. No edema or consolidation in the lungs.   Electronically Signed   By: Lowella Grip III M.D.   On: 10/13/2014 08:38    ROS: See chart Blood pressure 151/63, pulse 78, temperature 99.9 F (37.7 C), temperature source Oral, resp. rate 18, height _0  (1.575 m), weight 65.227 kg (143 lb 12.8 oz), SpO2 91 %. Physical Exam: Pleasant black  female in no acute distress. NG tube is in place. Abdomen soft, nontender, nondistended. No hepatosplenomegaly, masses, or hernias identified. No rigidity is  noted.  Assessment/Plan: Impression: Partial small bowel obstruction, improved Plan: Would continue NG tube decompression for now. This was discussed with the family that was present. No need for acute surgical intervention at this time. Will follow with you.  Renan Danese A 10/14/2014, 10:21 AM

## 2014-10-14 NOTE — Progress Notes (Signed)
NG tube removed by patient. Reinserted size 12 fr NG tube, placement verified by two nurses. Order placed for portable chest x-ray to verify placement. Tolerate well by patient.

## 2014-10-15 ENCOUNTER — Encounter (HOSPITAL_COMMUNITY): Payer: Self-pay | Admitting: Internal Medicine

## 2014-10-15 DIAGNOSIS — D72829 Elevated white blood cell count, unspecified: Secondary | ICD-10-CM | POA: Diagnosis not present

## 2014-10-15 LAB — URINE MICROSCOPIC-ADD ON

## 2014-10-15 LAB — URINALYSIS, ROUTINE W REFLEX MICROSCOPIC
Glucose, UA: NEGATIVE mg/dL
HGB URINE DIPSTICK: NEGATIVE
KETONES UR: 15 mg/dL — AB
LEUKOCYTES UA: NEGATIVE
Nitrite: NEGATIVE
Protein, ur: 30 mg/dL — AB
Specific Gravity, Urine: >1.03 — ABNORMAL HIGH (ref 1.005–1.030)
Urobilinogen, UA: 0.2 mg/dL (ref 0.0–1.0)
pH: 6 (ref 5.0–8.0)

## 2014-10-15 MED ORDER — DIPHENHYDRAMINE HCL 12.5 MG/5ML PO ELIX
12.5000 mg | ORAL_SOLUTION | Freq: Once | ORAL | Status: AC
  Administered 2014-10-15: 12.5 mg via ORAL
  Filled 2014-10-15: qty 5

## 2014-10-15 NOTE — Progress Notes (Signed)
Patient and daughter requested medication to help patient relax for sleep.  Benadryl elixir order by hospitalist and given.

## 2014-10-15 NOTE — Progress Notes (Signed)
Subjective: Patient has passed flatus. No abdominal pain noted.  Objective: Vital signs in last 24 hours: Temp:  [98.2 F (36.8 C)-99.4 F (37.4 C)] 98.4 F (36.9 C) (03/28 0616) Pulse Rate:  [56-81] 56 (03/28 0616) Resp:  [18-20] 18 (03/28 0616) BP: (146-183)/(64-81) 146/73 mmHg (03/28 0616) SpO2:  [88 %-97 %] 92 % (03/28 0616) Last BM Date: 10/13/14  Intake/Output from previous day: 03/27 0701 - 03/28 0700 In: 1711 [I.V.:1711] Out: 600 [Urine:500; Emesis/NG output:100] Intake/Output this shift: Total I/O In: -  Out: 250 [Urine:150; Emesis/NG output:100]  General appearance: alert, cooperative and appears stated age GI: soft, non-tender; bowel sounds normal; no masses,  no organomegaly  Lab Results:   Recent Labs  10/13/14 0800 10/14/14 0654  WBC 6.0 11.5*  HGB 12.5 12.8  HCT 38.4 39.8  PLT 183 187   BMET  Recent Labs  10/13/14 0800 10/14/14 0654  NA 137 143  K 3.5 3.5  CL 97 102  CO2 31 33*  GLUCOSE 121* 122*  BUN 21 24*  CREATININE 0.95 0.96  CALCIUM 9.3 8.4   PT/INR No results for input(s): LABPROT, INR in the last 72 hours.  Studies/Results: Dg Abd 1 View  10/14/2014   CLINICAL DATA:  Small bowel obstruction with nausea and weakness  EXAM: ABDOMEN - 1 VIEW  COMPARISON:  CT abdomen and pelvis October 13, 2014  FINDINGS: Nasogastric tube tip and side port are in the stomach. There remain loops of mildly dilated small bowel in the mid abdomen. Contrast is seen in the colon. No free air is seen on this supine examination. There is contrast within the urinary bladder. There is extensive degenerative type change throughout the lumbar spine.  IMPRESSION: Nasogastric tube tip and side port in stomach. Loops of dilated small bowel are noted. Contrast does reach colon. A degree of incomplete small bowel obstruction may well remain.   Electronically Signed   By: Lowella Grip III M.D.   On: 10/14/2014 09:22   Dg Chest Port 1 View  10/14/2014   CLINICAL  DATA:  Nasogastric tube replacement  EXAM: PORTABLE CHEST - 1 VIEW  COMPARISON:  Study obtained earlier in the day  FINDINGS: Nasogastric tube tip and side port are in the stomach. There is patchy airspace consolidation in the right base. There is minimal atelectasis in the left base. Lungs elsewhere clear. Heart is prominent but stable. Pulmonary vascularity is normal. No adenopathy.  IMPRESSION: Nasogastric tube tip and side port in stomach. Patchy infiltrate right base. Slight atelectasis left base. Heart prominent but stable.   Electronically Signed   By: Lowella Grip III M.D.   On: 10/14/2014 15:59   Dg Chest Port 1 View  10/14/2014   CLINICAL DATA:  Nausea and weakness.  Small bowel obstruction.  EXAM: PORTABLE CHEST - 1 VIEW  COMPARISON:  October 13, 2014  FINDINGS: Nasogastric tube tip and side port are in the stomach. There is new airspace consolidation in the right lung base. There is mild atelectasis in the left base. Lungs elsewhere clear. Heart is slightly prominent with pulmonary vascularity within normal limits. No adenopathy.  IMPRESSION: New patchy infiltrate right base. Mild left base atelectasis. No change in cardiac silhouette. Nasogastric tube tip and side port in stomach.   Electronically Signed   By: Lowella Grip III M.D.   On: 10/14/2014 07:39    Anti-infectives: Anti-infectives    None      Assessment/Plan: Impression: Small bowel obstruction, resolving Plan: We will remove  gastric tube and advanced to sips of clears. Will advance diet as tolerated.  LOS: 2 days    Katie Benitez A 10/15/2014

## 2014-10-15 NOTE — Care Management Utilization Note (Signed)
UR completed 

## 2014-10-15 NOTE — Care Management Note (Addendum)
    Page 1 of 2   10/16/2014     3:13:05 PM CARE MANAGEMENT NOTE 10/16/2014  Patient:  Katie Benitez,Katie Benitez   Account Number:  [id number]  Date Initiated:  10/15/2014  Documentation initiated by:  Jolene Provost  Subjective/Objective Assessment:   Pt is from home, lives with daughter and grandaughter. Pt has a walker but no other DME's or [place] services. Pt's daughter says she feels she may need a ramp to better access into home. Told ot call [facility] to ask about rental or purchase prices     Action/Plan:   for ramps. Pt plans to discharge home with self care. Anticipate need for PT eval prior to discharge. Will cont to follow.   Anticipated DC Date:  10/18/2014   Anticipated DC Plan:  [place]  CM consult      [facility] Choice  HOME HEALTH  DURABLE MEDICAL EQUIPMENT   Choice offered to / List presented to:  C-4 Adult Children   DME arranged  [place] arranged  [place]   Status of service:  Completed, signed off Medicare Important Message given?  YES (If response is "NO", the following Medicare IM given date fields will be blank) Date Medicare IM given:  10/16/2014 Medicare IM given by:  Theophilus Kinds Date Additional Medicare IM given:   Additional Medicare IM given by:    Discharge Disposition:  [place]  Per UR Regulation:  Reviewed for med. necessity/level of care/duration of stay  If discussed at [place] of Stay Meetings, dates discussed:    Comments:  10/16/14 [place], RN BSN CM Pt discharging home today with [facility] (per family choice). Orders faxed to [facility] in referrals. [place] services to start on 10/18/14. Prescription given to pts daughter for wheelchair. Pts daughter stated they will pick wheelchair up at [facility] in [place]. Pt and pts nurse aware of discharge arrangements.  10/15/2014 [place], RN, MSN, CM

## 2014-10-15 NOTE — Progress Notes (Signed)
[facility] PROGRESS NOTE  Katie Benitez ZMO:[id number] DOB: October 25, 1918 DOA: 10/13/2014 PCP: Bronson Curb, PA-C  Assessment/Plan: Partial SBO - Drainage from NG drainage 2101ml last 24 hours reportedly. Abdomen soft. Await surgical recommendation regarding NG tube. Reports passing gas -Abd film 10/14/14 showed contrast passing into the colon. -Appreciate surgical input.  HTN -BP meds on hold given NPO state. SBP range 146-183. Continue prn hydralazine. Plan to resume po home meds when able  Leukocytosis - mild  - chest xray 10/14/14 with right patchy infiltrate. Max temp last 24 hours 99.9. Oxygen saturation level 88% last night room air. Improved this morning. BS distant but clear.  - Incentive spirometry, mobilize  -obtain urinalysis  Code Status: full Family Communication: daughter at bedside Disposition Plan: to be determined   Consultants:  General surgery  Procedures:  NG tube  Antibiotics:  none  HPI/Subjective: Reports feeling hungry. Reports passing flatus. Denies pain/discomfort  Objective: Filed Vitals:   10/15/14 0616  BP: 146/73  Pulse: 56  Temp: 98.4 F (36.9 C)  Resp: 18    Intake/Output Summary (Last 24 hours) at 10/15/14 0849 Last data filed at 10/15/14 0543  Gross per 24 hour  Intake   1811 ml  Output    500 ml  Net   1311 ml   Filed Weights   10/13/14 0726 10/13/14 1611 10/14/14 0551  Weight: 65.772 kg (145 lb) 66.407 kg (146 lb 6.4 oz) 65.227 kg (143 lb 12.8 oz)    Exam:   General:  Well nourished appears comfortable  Cardiovascular: RRR no mgr no LE edema  Respiratory: somewhat shallow BS clear but distant  Abdomen: nondistended soft  Sluggish BS non-tender to palpation  Musculoskeletal: joints without swelling/erythema  Data Reviewed: Basic Metabolic Panel:  Recent Labs Lab 10/13/14 0800 10/14/14 0654  NA 137 143  K 3.5 3.5  CL 97 102  CO2 31 33*  GLUCOSE 121* 122*  BUN 21 24*  CREATININE 0.95  0.96  CALCIUM 9.3 8.4   Liver Function Tests:  Recent Labs Lab 10/13/14 0800  AST 18  ALT 12  ALKPHOS 64  BILITOT 0.7  PROT 7.1  ALBUMIN 3.8   No results for input(s): LIPASE, AMYLASE in the last 168 hours. No results for input(s): AMMONIA in the last 168 hours. CBC:  Recent Labs Lab 10/13/14 0800 10/14/14 0654  WBC 6.0 11.5*  NEUTROABS 4.9  --   HGB 12.5 12.8  HCT 38.4 39.8  MCV 93.0 93.2  PLT 183 187   Cardiac Enzymes: No results for input(s): CKTOTAL, CKMB, CKMBINDEX, TROPONINI in the last 168 hours. BNP (last 3 results) No results for input(s): BNP in the last 8760 hours.  ProBNP (last 3 results) No results for input(s): PROBNP in the last 8760 hours.  CBG: No results for input(s): GLUCAP in the last 168 hours.  No results found for this or any previous visit (from the past 240 hour(s)).   Studies: Dg Abd 1 View  10/14/2014   CLINICAL DATA:  Small bowel obstruction with nausea and weakness  EXAM: ABDOMEN - 1 VIEW  COMPARISON:  CT abdomen and pelvis October 13, 2014  FINDINGS: Nasogastric tube tip and side port are in the stomach. There remain loops of mildly dilated small bowel in the mid abdomen. Contrast is seen in the colon. No free air is seen on this supine examination. There is contrast within the urinary bladder. There is extensive degenerative type change throughout the lumbar spine.  IMPRESSION: Nasogastric tube tip  and side port in stomach. Loops of dilated small bowel are noted. Contrast does reach colon. A degree of incomplete small bowel obstruction may well remain.   Electronically Signed   By: Lowella Grip III M.D.   On: 10/14/2014 09:22   Ct Abdomen Pelvis W Contrast  10/13/2014   CLINICAL DATA:  Nausea with onset last night. Abdominal pain, unspecified abdominal location.  EXAM: CT ABDOMEN AND PELVIS WITH CONTRAST  TECHNIQUE: Multidetector CT imaging of the abdomen and pelvis was performed using the standard protocol following bolus  administration of intravenous contrast.  CONTRAST:  66mL OMNIPAQUE IOHEXOL 300 MG/ML SOLN, 188mL OMNIPAQUE IOHEXOL 300 MG/ML SOLN  COMPARISON:  Abdominal series [phone]  FINDINGS: Prominent lung markings at the lung bases suggestive for atelectasis or scarring. Negative for pleural effusions. No evidence for free intraperitoneal air.  Low-attenuation fluid in the subcarinal region could be related to the pericardial recess. Atherosclerotic calcifications in the coronary arteries. No significant pericardial effusion.  There is a trace amount of perihepatic fluid near the anterior dome. Gallbladder is mildly distended. Portal venous system is patent. Normal appearance of the spleen and pancreas. There is trace fluid along the left hemidiaphragm and adjacent to the spleen. Indeterminate 1.6 cm right adrenal nodule. Normal appearance the left adrenal gland. Mildly dense round structure along the right kidney upper pole (measures 1.5 cm) is indeterminate based on the Hounsfield units (31). There are additional small hypodense structures throughout the right kidney that probably represent cysts. Probable small cysts in the left kidney. No evidence for hydronephrosis.  Uterus appears to be surgically absent. Moderate amount of fluid in the urinary bladder. Trace free fluid in the pelvis. Small amount of free fluid in the left paracolic gutter. There is free fluid and mesenteric edema in the right lower abdomen. Dilated loops of small bowel in the mid and right abdomen. The terminal ileum and appendix appear to be normal. Dilated small bowel loops measuring up to 2.7 cm.  No significant lymphadenopathy. Diffuse atherosclerotic calcifications involving the arterial structures. The abdominal aorta at the hiatus measures 3.1 cm. Large amount of mural thrombus along the right side of the infrarenal abdominal aorta. The infrarenal abdominal aorta is ectatic measuring up to 2.8 cm.  Severe multilevel degenerative disease in the  lumbar spine. Anterolisthesis at L4-L5.  IMPRESSION: Dilated loops of small bowel with abdominal ascites. The distal small bowel is decompressed. Findings are suggestive for a small bowel obstruction. A transition point is not clearly identified.  Several low density structures in the kidneys are suggestive for cysts. Largest in the right kidney upper pole is indeterminate based on the Hounsfield units.  1.6 cm right adrenal nodule is indeterminate. Favor a benign etiology based on the size.  Diffuse atherosclerotic disease involving the aorta and coronary arteries.  These results were called by telephone at the time of interpretation on 10/13/2014 at 11:54 am to Dr. Leonard Schwartz , who verbally acknowledged these results.   Electronically Signed   By: Markus Daft M.D.   On: 10/13/2014 11:55   Dg Chest Port 1 View  10/14/2014   CLINICAL DATA:  Nasogastric tube replacement  EXAM: PORTABLE CHEST - 1 VIEW  COMPARISON:  Study obtained earlier in the day  FINDINGS: Nasogastric tube tip and side port are in the stomach. There is patchy airspace consolidation in the right base. There is minimal atelectasis in the left base. Lungs elsewhere clear. Heart is prominent but stable. Pulmonary vascularity is normal. No adenopathy.  IMPRESSION: Nasogastric tube tip and side port in stomach. Patchy infiltrate right base. Slight atelectasis left base. Heart prominent but stable.   Electronically Signed   By: Lowella Grip III M.D.   On: 10/14/2014 15:59   Dg Chest Port 1 View  10/14/2014   CLINICAL DATA:  Nausea and weakness.  Small bowel obstruction.  EXAM: PORTABLE CHEST - 1 VIEW  COMPARISON:  October 13, 2014  FINDINGS: Nasogastric tube tip and side port are in the stomach. There is new airspace consolidation in the right lung base. There is mild atelectasis in the left base. Lungs elsewhere clear. Heart is slightly prominent with pulmonary vascularity within normal limits. No adenopathy.  IMPRESSION: New patchy infiltrate  right base. Mild left base atelectasis. No change in cardiac silhouette. Nasogastric tube tip and side port in stomach.   Electronically Signed   By: Lowella Grip III M.D.   On: 10/14/2014 07:39    Scheduled Meds: . heparin  5,000 Units Subcutaneous 3 times per day   Continuous Infusions: . sodium chloride 75 mL/hr at 10/14/14 2011    Principal Problem:   Small bowel obstruction Active Problems:   HTN (hypertension)   SBO (small bowel obstruction)   Leukocytosis    Time spent: 35 minutes    [facility] Pager [phone]. If 7PM-7AM, please contact night-coverage at [email or web address], password [facility] 10/15/2014, 8:49 AM  LOS: 2 days

## 2014-10-16 ENCOUNTER — Emergency Department (HOSPITAL_COMMUNITY): Payer: Medicare Other

## 2014-10-16 ENCOUNTER — Encounter (HOSPITAL_COMMUNITY): Payer: Self-pay

## 2014-10-16 ENCOUNTER — Inpatient Hospital Stay (HOSPITAL_COMMUNITY)
Admission: EM | Admit: 2014-10-16 | Discharge: 2014-10-21 | Disposition: A | Discharge status: Home / Self Care | Payer: Medicare Other | Source: Home / Self Care | Attending: Internal Medicine | Admitting: Internal Medicine

## 2014-10-16 DIAGNOSIS — Z8249 Family history of ischemic heart disease and other diseases of the circulatory system: Secondary | ICD-10-CM

## 2014-10-16 DIAGNOSIS — K56609 Unspecified intestinal obstruction, unspecified as to partial versus complete obstruction: Secondary | ICD-10-CM

## 2014-10-16 DIAGNOSIS — I10 Essential (primary) hypertension: Secondary | ICD-10-CM | POA: Diagnosis present

## 2014-10-16 DIAGNOSIS — Z4659 Encounter for fitting and adjustment of other gastrointestinal appliance and device: Secondary | ICD-10-CM

## 2014-10-16 DIAGNOSIS — K5660 Unspecified intestinal obstruction: Principal | ICD-10-CM | POA: Diagnosis present

## 2014-10-16 DIAGNOSIS — E870 Hyperosmolality and hypernatremia: Secondary | ICD-10-CM | POA: Diagnosis not present

## 2014-10-16 DIAGNOSIS — H409 Unspecified glaucoma: Secondary | ICD-10-CM | POA: Diagnosis present

## 2014-10-16 DIAGNOSIS — R109 Unspecified abdominal pain: Secondary | ICD-10-CM

## 2014-10-16 DIAGNOSIS — Z8673 Personal history of transient ischemic attack (TIA), and cerebral infarction without residual deficits: Secondary | ICD-10-CM

## 2014-10-16 DIAGNOSIS — Z87891 Personal history of nicotine dependence: Secondary | ICD-10-CM

## 2014-10-16 LAB — CBC
HCT: 38 % (ref 36.0–46.0)
Hemoglobin: 12.1 g/dL (ref 12.0–15.0)
MCH: 29.8 pg (ref 26.0–34.0)
MCHC: 31.8 g/dL (ref 30.0–36.0)
MCV: 93.6 fL (ref 78.0–100.0)
PLATELETS: 160 10*3/uL (ref 150–400)
RBC: 4.06 MIL/uL (ref 3.87–5.11)
RDW: 12.9 % (ref 11.5–15.5)
WBC: 7.3 10*3/uL (ref 4.0–10.5)

## 2014-10-16 LAB — BASIC METABOLIC PANEL
Anion gap: 8 (ref 5–15)
BUN: 15 mg/dL (ref 6–23)
CO2: 29 mmol/L (ref 19–32)
Calcium: 7.9 mg/dL — ABNORMAL LOW (ref 8.4–10.5)
Chloride: 108 mmol/L (ref 96–112)
Creatinine, Ser: 0.75 mg/dL (ref 0.50–1.10)
GFR calc non Af Amer: 70 mL/min — ABNORMAL LOW (ref >90)
GFR, EST AFRICAN AMERICAN: 81 mL/min — AB (ref >90)
Glucose, Bld: 104 mg/dL — ABNORMAL HIGH (ref 70–99)
POTASSIUM: 3.4 mmol/L — AB (ref 3.5–5.1)
SODIUM: 145 mmol/L (ref 135–145)

## 2014-10-16 MED ORDER — POTASSIUM CHLORIDE CRYS ER 20 MEQ PO TBCR
40.0000 meq | EXTENDED_RELEASE_TABLET | Freq: Once | ORAL | Status: AC
  Administered 2014-10-16: 40 meq via ORAL
  Filled 2014-10-16: qty 2

## 2014-10-16 MED ORDER — MAGNESIUM HYDROXIDE 400 MG/5ML PO SUSP
15.0000 mL | Freq: Two times a day (BID) | ORAL | Status: DC
  Administered 2014-10-16: 15 mL via ORAL
  Filled 2014-10-16: qty 30

## 2014-10-16 MED ORDER — CLONIDINE HCL 0.1 MG PO TABS
0.1000 mg | ORAL_TABLET | Freq: Two times a day (BID) | ORAL | Status: DC
  Administered 2014-10-16: 0.1 mg via ORAL
  Filled 2014-10-16: qty 1

## 2014-10-16 NOTE — Evaluation (Signed)
Physical Therapy Evaluation Patient Details Name: Katie Benitez MRN: [id number] DOB: 1918/09/25 Today's Date: 10/16/2014   History of Present Illness  Pt is a 81 year old who was admitted with a small bowel obstruction.  She has been managed conservatively and is progressing well with NG tube removed.  She is now beginning to ingest liquids.  Pt lives with her daughter and is normally independent with a 4 wheeled walker at home.  Clinical Impression  Pt was seen for evaluation and found to be alert, oriented and very cooperative.  She reports feeling "fine".  Pt has strength to WNL but is quite deconditioned.  She was able to transfer to sitting and standing with SBA but her gait is extremely labored and she was only able to walk 10' to the bathroom and then back.  She denies any weakness or fatigue but states that she didn't sleep well last night. Pt fully leans over the walker during gait and needs full supervision.  Family plans for her to go home at d/c and I would recommend HHPT at that time.  She may need a w/c as she does not yet have functional endurance.    Follow Up Recommendations Home health PT    Equipment Recommendations  Wheelchair (measurements PT) (may need a w/c if family agrees...pt is very weak and does not have functional gait for a home setting.)    Recommendations for Other Services   none    Precautions / Restrictions Precautions Precautions: Fall Restrictions Weight Bearing Restrictions: No      Mobility  Bed Mobility Overal bed mobility: Modified Independent                Transfers Overall transfer level: Needs assistance Equipment used: Rolling walker (2 wheeled) Transfers: Sit to/from Stand Sit to Stand: Min guard         General transfer comment: pt needs instruction for correct hand placement when going from sit to stand  Ambulation/Gait Ambulation/Gait assistance: Min guard Ambulation Distance (Feet): 10 Feet Assistive device:  Rolling walker (2 wheeled) Gait Pattern/deviations: Trunk flexed;Shuffle   Gait velocity interpretation: Below normal speed for age/gender General Gait Details: gait is extremely labored with pt fully leaning over the walker and forearms on the walker grips...pt states that sometimes she is able to stand upright and sometimes she cannot  Science writer    Modified Rankin (Stroke Patients Only)       Balance Overall balance assessment: Needs assistance Sitting-balance support: No upper extremity supported;Feet supported Sitting balance-Leahy Scale: Fair     Standing balance support: Bilateral upper extremity supported Standing balance-Leahy Scale: Fair                               Pertinent Vitals/Pain Pain Assessment: No/denies pain    Home Living Family/patient expects to be discharged to:: Private residence Living Arrangements: Children Available Help at Discharge: Family;Available 24 hours/day Type of Home: House Home Access: Stairs to enter Entrance Stairs-Rails: Right Entrance Stairs-Number of Steps: 3-4 Home Layout: One level Home Equipment: Walker - 4 wheels;Cane - single point      Prior Function Level of Independence: Independent with assistive device(s)               Hand Dominance        Extremity/Trunk Assessment   Upper Extremity Assessment: Overall WFL for tasks assessed  Lower Extremity Assessment: Overall WFL for tasks assessed (pt is deconditioned)      Cervical / Trunk Assessment: Kyphotic;Other exceptions  Communication   Communication: No difficulties  Cognition Arousal/Alertness: Awake/alert Behavior During Therapy: WFL for tasks assessed/performed Overall Cognitive Status: Within Functional Limits for tasks assessed                      General Comments      Exercises        Assessment/Plan    PT Assessment All further PT needs can be met in the next  venue of care  PT Diagnosis Difficulty walking;Generalized weakness   PT Problem List Decreased strength;Decreased activity tolerance;Decreased mobility  PT Treatment Interventions     PT Goals (Current goals can be found in the Care Plan section) Acute Rehab PT Goals PT Goal Formulation: All assessment and education complete, DC therapy    Frequency     Barriers to discharge  steps at entrance..family is considering getting a ramp and this is an excellent idea      Co-evaluation               End of Session Equipment Utilized During Treatment: Gait belt Activity Tolerance: Patient limited by fatigue Patient left: in chair;with call bell/phone within reach;with chair alarm set Nurse Communication: Mobility status         Time: 5041-3643 PT Time Calculation (min) (ACUTE ONLY): 36 min   Charges:   PT Evaluation $Initial PT Evaluation Tier I: 1 Procedure     PT G CodesDemetrios Isaacs L 10/16/2014, 12:13 PM

## 2014-10-16 NOTE — Progress Notes (Signed)
[facility] PROGRESS NOTE  Katie Benitez QJJ:[id number] DOB: [birth year] DOA: 10/13/2014 PCP: Bronson Curb, PA-C  Assessment/Plan: Partial SBO - NG removed 10/15/14. Tolerating clear liquids. Abdomen soft. No BM. Will advance diet. appreciate surgical recommendation.Abd film 10/14/14 showed contrast passing into the colon.  HTN - poor control related to BP meds on hold given NPO state. Home meds include norvasc, atenolol, clonidine, HCTZ and quinapril. Will resume clonidine today. Anticipate needing to resume all home meds. Will monitor and resume as indicated.   Leukocytosis - resolved today.   - chest xray 10/14/14 with right patchy infiltrate. Max temp last 24 hours 99.7. Oxygen saturation level >90 on room air.   - Incentive spirometry, mobilize  - urinalysis with many squamous epithelial cells and many bacteria, no leukocytes no ntitrite   Code Status: full Family Communication:  Disposition Plan: discharge hopefully 24-48 hours   Consultants:  General surgery  Procedures:  NG  Antibiotics:  none  HPI/Subjective: Denies pain/discomfort. Denies nausea. No BM. Reports hunger  Objective: Filed Vitals:   10/16/14 0603  BP: 182/88  Pulse: 86  Temp: 98.2 F (36.8 C)  Resp: 18    Intake/Output Summary (Last 24 hours) at 10/16/14 0930 Last data filed at 10/16/14 0917  Gross per 24 hour  Intake 1326.25 ml  Output    400 ml  Net 926.25 ml   Filed Weights   10/13/14 0726 10/13/14 1611 10/14/14 0551  Weight: 65.772 kg (145 lb) 66.407 kg (146 lb 6.4 oz) 65.227 kg (143 lb 12.8 oz)    Exam:   General:  Appears comfortable   Cardiovascular: RRR no mgr no LE edema  Respiratory: normal effort somewhat shallow BS clear but distant no crackles  Abdomen: soft non-distended +BS no guarding or rebounding  Musculoskeletal: no clubbing or cyanosis  Data Reviewed: Basic Metabolic Panel:  Recent Labs Lab 10/13/14 0800 10/14/14 0654 10/16/14 0639   NA 137 143 145  K 3.5 3.5 3.4*  CL 97 102 108  CO2 31 33* 29  GLUCOSE 121* 122* 104*  BUN 21 24* 15  CREATININE 0.95 0.96 0.75  CALCIUM 9.3 8.4 7.9*   Liver Function Tests:  Recent Labs Lab 10/13/14 0800  AST 18  ALT 12  ALKPHOS 64  BILITOT 0.7  PROT 7.1  ALBUMIN 3.8   No results for input(s): LIPASE, AMYLASE in the last 168 hours. No results for input(s): AMMONIA in the last 168 hours. CBC:  Recent Labs Lab 10/13/14 0800 10/14/14 0654 10/16/14 0639  WBC 6.0 11.5* 7.3  NEUTROABS 4.9  --   --   HGB 12.5 12.8 12.1  HCT 38.4 39.8 38.0  MCV 93.0 93.2 93.6  PLT 183 187 160   Cardiac Enzymes: No results for input(s): CKTOTAL, CKMB, CKMBINDEX, TROPONINI in the last 168 hours. BNP (last 3 results) No results for input(s): BNP in the last 8760 hours.  ProBNP (last 3 results) No results for input(s): PROBNP in the last 8760 hours.  CBG: No results for input(s): GLUCAP in the last 168 hours.  No results found for this or any previous visit (from the past 240 hour(s)).   Studies: Dg Chest Port 1 View  10/14/2014   CLINICAL DATA:  Nasogastric tube replacement  EXAM: PORTABLE CHEST - 1 VIEW  COMPARISON:  Study obtained earlier in the day  FINDINGS: Nasogastric tube tip and side port are in the stomach. There is patchy airspace consolidation in the right base. There is minimal atelectasis in the left base.  Lungs elsewhere clear. Heart is prominent but stable. Pulmonary vascularity is normal. No adenopathy.  IMPRESSION: Nasogastric tube tip and side port in stomach. Patchy infiltrate right base. Slight atelectasis left base. Heart prominent but stable.   Electronically Signed   By: Lowella Grip III M.D.   On: 10/14/2014 15:59    Scheduled Meds: . cloNIDine  0.1 mg Oral BID  . heparin  5,000 Units Subcutaneous 3 times per day  . magnesium hydroxide  15 mL Oral BID  . potassium chloride  40 mEq Oral Once   Continuous Infusions: . sodium chloride 75 mL/hr at 10/15/14  0946    Principal Problem:   Small bowel obstruction Active Problems:   HTN (hypertension)   SBO (small bowel obstruction)   Leukocytosis    Time spent: 35 minutes    [facility] Pager [phone]. If 7PM-7AM, please contact night-coverage at [email or web address], password [facility] 10/16/2014, 9:30 AM  LOS: 3 days

## 2014-10-16 NOTE — Progress Notes (Signed)
Patient discharged home today.  Patient and daughter were given discharge instructions and verbalized understanding with no complaints or concerns voiced at this time. Patient's  IV was removed with catheter intact, no bleeding or complications, earlier in the morning prior to discharge.  Patient left unit in stable condition by a staff member in a wheelchair.

## 2014-10-16 NOTE — Progress Notes (Signed)
  Subjective: Patient states she is having flatus. No bowel movement noted. Tolerating clear liquids.  Objective: Vital signs in last 24 hours: Temp:  [98.2 F (36.8 C)-99.7 F (37.6 C)] 98.2 F (36.8 C) (03/29 0603) Pulse Rate:  [47-86] 86 (03/29 0603) Resp:  [18] 18 (03/29 0603) BP: (171-182)/(61-88) 182/88 mmHg (03/29 0603) SpO2:  [93 %-95 %] 95 % (03/29 0603) Last BM Date: 10/13/14  Intake/Output from previous day: 03/28 0701 - 03/29 0700 In: 1086.3 [P.O.:240; I.V.:846.3] Out: 500 [Urine:400; Emesis/NG output:100] Intake/Output this shift:    General appearance: alert and cooperative GI: soft, non-tender; bowel sounds normal; no masses,  no organomegaly  Lab Results:   Recent Labs  10/14/14 0654 10/16/14 0639  WBC 11.5* 7.3  HGB 12.8 12.1  HCT 39.8 38.0  PLT 187 160   BMET  Recent Labs  10/14/14 0654 10/16/14 0639  NA 143 145  K 3.5 3.4*  CL 102 108  CO2 33* 29  GLUCOSE 122* 104*  BUN 24* 15  CREATININE 0.96 0.75  CALCIUM 8.4 7.9*   PT/INR No results for input(s): LABPROT, INR in the last 72 hours.  Studies/Results: Dg Chest Port 1 View  10/14/2014   CLINICAL DATA:  Nasogastric tube replacement  EXAM: PORTABLE CHEST - 1 VIEW  COMPARISON:  Study obtained earlier in the day  FINDINGS: Nasogastric tube tip and side port are in the stomach. There is patchy airspace consolidation in the right base. There is minimal atelectasis in the left base. Lungs elsewhere clear. Heart is prominent but stable. Pulmonary vascularity is normal. No adenopathy.  IMPRESSION: Nasogastric tube tip and side port in stomach. Patchy infiltrate right base. Slight atelectasis left base. Heart prominent but stable.   Electronically Signed   By: Lowella Grip III M.D.   On: 10/14/2014 15:59    Anti-infectives: Anti-infectives    None      Assessment/Plan: Impression: Small bowel obstruction, resolving Plan: Agree with advancing diet as tolerated. No need for acute  surgical intervention.  LOS: 3 days    Layal Javid A 10/16/2014

## 2014-10-16 NOTE — Discharge Summary (Signed)
Physician Discharge Summary  Katie Benitez DGL:[id number] DOB: [birth year] DOA: 10/13/2014  PCP: Bronson Curb, PA-C  Admit date: 10/13/2014 Discharge date: 10/16/2014  Time spent:  minutes  Recommendations for Outpatient Follow-up:  1. Follow up with PCP 1 week for evaluation of symptoms  Discharge Diagnoses:  Principal Problem:   Small bowel obstruction Active Problems:   HTN (hypertension)   SBO (small bowel obstruction)   Leukocytosis   Discharge Condition: stable  Diet recommendation: full liquid to advance as toleratd  Filed Weights   10/13/14 0726 10/13/14 1611 10/14/14 0551  Weight: 65.772 kg (145 lb) 66.407 kg (146 lb 6.4 oz) 65.227 kg (143 lb 12.8 oz)    History of present illness:  Patient is a 81 year old woman with history significant for hypertension. She stated that around 3:00 am on 10/13/14 she started having severe abdominal pain and vomited 3 times yellow color, no blood. She described abdominal pain as generalized. She came into the hospital where CT scan was performed and showed a small bowel obstruction without clear transition point, however distal small bowel appear to be decompressed. Surgery, Dr. Arnoldo Morale was consulted by EDP with recommendations for NG tube placement and medical admission with hopes of conservative treatment only. Marland Kitchen   Hospital Course:  Partial SBO - NG removed 10/15/14. Tolerating full liquids at discharge. Abdomen soft. Passing flatus. Evaluated by general surgery who agreed with discharge as no surgical intervention warranged. Abd film 10/14/14 showed contrast passing into the colon. Follow up with PCP 1-2 weeks for evaluation of symptoms  HTN - poor control related to BP meds being on hold given NPO state. Home meds include norvasc, atenolol, clonidine, HCTZ and quinapril. Will resume.   Leukocytosis - resolved at discharge. Chest xray 10/14/14 with right patchy infiltrate. Max temp last 24 hours 99.7. Oxygen saturation level >90  on room air.Urinalysis with many squamous epithelial cells and many bacteria, no leukocytes no nitrite.   Procedures:  NG tube  Consultations:  General surgery  Discharge Exam: Filed Vitals:   10/16/14 0603  BP: 182/88  Pulse: 86  Temp: 98.2 F (36.8 C)  Resp: 18    General: appears comfortable Cardiovascular: RRR no MGR No LE edema Respiratory: normal effort BS clear bilaterally no wheeze Abdomen: soft +BS non-tender  Discharge Instructions    Current Discharge Medication List    CONTINUE these medications which have NOT CHANGED   Details  amLODipine (NORVASC) 5 MG tablet Take 5 mg by mouth daily.    atenolol (TENORMIN) 100 MG tablet Take 100 mg by mouth daily.    Calcium Carb-Cholecalciferol 600-200 MG-UNIT TABS Take 1 tablet by mouth 2 (two) times daily.    cloNIDine (CATAPRES) 0.1 MG tablet Take 0.1 mg by mouth 2 (two) times daily.    docusate sodium (COLACE) 100 MG capsule Take 200 mg by mouth at bedtime.    hydrochlorothiazide (HYDRODIURIL) 25 MG tablet Take 25 mg by mouth daily.    quinapril (ACCUPRIL) 40 MG tablet Take 40 mg by mouth daily.    tamsulosin (FLOMAX) 0.4 MG CAPS capsule Take 0.4 mg by mouth daily.       No Known Allergies Follow-up Information    Follow up with ROBERTSON, ANTHONY T, PA-C. Schedule an appointment as soon as possible for a visit in 1 week.   Specialty:  Physician Assistant   Why:  follow up for evaluation of symptoms   Contact information:   [address] [place] [address] [phone]  The results of significant diagnostics from this hospitalization (including imaging, microbiology, ancillary and laboratory) are listed below for reference.    Significant Diagnostic Studies: Dg Abd 1 View  10/14/2014   CLINICAL DATA:  Small bowel obstruction with nausea and weakness  EXAM: ABDOMEN - 1 VIEW  COMPARISON:  CT abdomen and pelvis October 13, 2014  FINDINGS: Nasogastric tube tip and side port are in the  stomach. There remain loops of mildly dilated small bowel in the mid abdomen. Contrast is seen in the colon. No free air is seen on this supine examination. There is contrast within the urinary bladder. There is extensive degenerative type change throughout the lumbar spine.  IMPRESSION: Nasogastric tube tip and side port in stomach. Loops of dilated small bowel are noted. Contrast does reach colon. A degree of incomplete small bowel obstruction may well remain.   Electronically Signed   By: Lowella Grip III M.D.   On: 10/14/2014 09:22   Ct Abdomen Pelvis W Contrast  10/13/2014   CLINICAL DATA:  Nausea with onset last night. Abdominal pain, unspecified abdominal location.  EXAM: CT ABDOMEN AND PELVIS WITH CONTRAST  TECHNIQUE: Multidetector CT imaging of the abdomen and pelvis was performed using the standard protocol following bolus administration of intravenous contrast.  CONTRAST:  4mL OMNIPAQUE IOHEXOL 300 MG/ML SOLN, 179mL OMNIPAQUE IOHEXOL 300 MG/ML SOLN  COMPARISON:  Abdominal series [phone]  FINDINGS: Prominent lung markings at the lung bases suggestive for atelectasis or scarring. Negative for pleural effusions. No evidence for free intraperitoneal air.  Low-attenuation fluid in the subcarinal region could be related to the pericardial recess. Atherosclerotic calcifications in the coronary arteries. No significant pericardial effusion.  There is a trace amount of perihepatic fluid near the anterior dome. Gallbladder is mildly distended. Portal venous system is patent. Normal appearance of the spleen and pancreas. There is trace fluid along the left hemidiaphragm and adjacent to the spleen. Indeterminate 1.6 cm right adrenal nodule. Normal appearance the left adrenal gland. Mildly dense round structure along the right kidney upper pole (measures 1.5 cm) is indeterminate based on the Hounsfield units (31). There are additional small hypodense structures throughout the right kidney that probably  represent cysts. Probable small cysts in the left kidney. No evidence for hydronephrosis.  Uterus appears to be surgically absent. Moderate amount of fluid in the urinary bladder. Trace free fluid in the pelvis. Small amount of free fluid in the left paracolic gutter. There is free fluid and mesenteric edema in the right lower abdomen. Dilated loops of small bowel in the mid and right abdomen. The terminal ileum and appendix appear to be normal. Dilated small bowel loops measuring up to 2.7 cm.  No significant lymphadenopathy. Diffuse atherosclerotic calcifications involving the arterial structures. The abdominal aorta at the hiatus measures 3.1 cm. Large amount of mural thrombus along the right side of the infrarenal abdominal aorta. The infrarenal abdominal aorta is ectatic measuring up to 2.8 cm.  Severe multilevel degenerative disease in the lumbar spine. Anterolisthesis at L4-L5.  IMPRESSION: Dilated loops of small bowel with abdominal ascites. The distal small bowel is decompressed. Findings are suggestive for a small bowel obstruction. A transition point is not clearly identified.  Several low density structures in the kidneys are suggestive for cysts. Largest in the right kidney upper pole is indeterminate based on the Hounsfield units.  1.6 cm right adrenal nodule is indeterminate. Favor a benign etiology based on the size.  Diffuse atherosclerotic disease involving the aorta and coronary arteries.  These results were called by telephone at the time of interpretation on 10/13/2014 at 11:54 am to Dr. Leonard Schwartz , who verbally acknowledged these results.   Electronically Signed   By: Markus Daft M.D.   On: 10/13/2014 11:55   Dg Chest Port 1 View  10/14/2014   CLINICAL DATA:  Nasogastric tube replacement  EXAM: PORTABLE CHEST - 1 VIEW  COMPARISON:  Study obtained earlier in the day  FINDINGS: Nasogastric tube tip and side port are in the stomach. There is patchy airspace consolidation in the right base.  There is minimal atelectasis in the left base. Lungs elsewhere clear. Heart is prominent but stable. Pulmonary vascularity is normal. No adenopathy.  IMPRESSION: Nasogastric tube tip and side port in stomach. Patchy infiltrate right base. Slight atelectasis left base. Heart prominent but stable.   Electronically Signed   By: Lowella Grip III M.D.   On: 10/14/2014 15:59   Dg Chest Port 1 View  10/14/2014   CLINICAL DATA:  Nausea and weakness.  Small bowel obstruction.  EXAM: PORTABLE CHEST - 1 VIEW  COMPARISON:  October 13, 2014  FINDINGS: Nasogastric tube tip and side port are in the stomach. There is new airspace consolidation in the right lung base. There is mild atelectasis in the left base. Lungs elsewhere clear. Heart is slightly prominent with pulmonary vascularity within normal limits. No adenopathy.  IMPRESSION: New patchy infiltrate right base. Mild left base atelectasis. No change in cardiac silhouette. Nasogastric tube tip and side port in stomach.   Electronically Signed   By: Lowella Grip III M.D.   On: 10/14/2014 07:39   Dg Abd Acute W/chest  10/13/2014   CLINICAL DATA:  Nausea and vomiting  EXAM: ACUTE ABDOMEN SERIES (ABDOMEN 2 VIEW & CHEST 1 VIEW)  COMPARISON:  None  FINDINGS: PA chest: No edema or consolidation. Heart is upper normal in size with pulmonary vascularity within normal limits. No adenopathy. There is degenerative change in the thoracic spine.  Supine and upright abdomen: There is moderate stool in the colon. There is no bowel dilatation or air-fluid level suggesting obstruction. No free air. There are surgical clips in the you lower abdomen and pelvis. There is degenerative change in the lumbar spine.  IMPRESSION: Overall bowel gas pattern unremarkable. No demonstrable obstruction or free air. No edema or consolidation in the lungs.   Electronically Signed   By: Lowella Grip III M.D.   On: 10/13/2014 08:38    Microbiology: No results found for this or any previous  visit (from the past 240 hour(s)).   Labs: Basic Metabolic Panel:  Recent Labs Lab 10/13/14 0800 10/14/14 0654 10/16/14 0639  NA 137 143 145  K 3.5 3.5 3.4*  CL 97 102 108  CO2 31 33* 29  GLUCOSE 121* 122* 104*  BUN 21 24* 15  CREATININE 0.95 0.96 0.75  CALCIUM 9.3 8.4 7.9*   Liver Function Tests:  Recent Labs Lab 10/13/14 0800  AST 18  ALT 12  ALKPHOS 64  BILITOT 0.7  PROT 7.1  ALBUMIN 3.8   No results for input(s): LIPASE, AMYLASE in the last 168 hours. No results for input(s): AMMONIA in the last 168 hours. CBC:  Recent Labs Lab 10/13/14 0800 10/14/14 0654 10/16/14 0639  WBC 6.0 11.5* 7.3  NEUTROABS 4.9  --   --   HGB 12.5 12.8 12.1  HCT 38.4 39.8 38.0  MCV 93.0 93.2 93.6  PLT 183 187 160   Cardiac Enzymes: No results for  input(s): CKTOTAL, CKMB, CKMBINDEX, TROPONINI in the last 168 hours. BNP: BNP (last 3 results) No results for input(s): BNP in the last 8760 hours.  ProBNP (last 3 results) No results for input(s): PROBNP in the last 8760 hours.  CBG: No results for input(s): GLUCAP in the last 168 hours.     SignedRadene Gunning  [facility] 10/16/2014, 11:18 AM

## 2014-10-16 NOTE — ED Provider Notes (Signed)
CSN: [id number]     Arrival date & time 10/16/14  1940 History   This chart was scribed for Davonna Belling, MD by Randa Evens, ED Scribe. This patient was seen in room [facility] and the patient's care was started at 9:56 PM.     Chief Complaint  Patient presents with  . Abdominal Pain   HPI HPI Comments: Katie Benitez is a 81 y.o. female who presents to the Emergency Department complaining of abdominal pain onset 4 days prior. Pt has some associated worsening right swelling on the right side and nausea.  Pt state she is having some slight flatus. Pt states she has had a small BM today. Pt was recently discharged after being treated for small bowel obstruction. Pt denies any alleviating factors. Pt doesn't report any medications PTA. Pt denies vomiting or other related symptoms.    Past Medical History  Diagnosis Date  . Diverticulitis   . Hypertension   . TIA (transient ischemic attack)   . Glaucoma   . Cancer     uterine  . SBO (small bowel obstruction)    Past Surgical History  Procedure Laterality Date  . Abdominal hysterectomy    . Glaucoma surgery     Family History  Problem Relation Age of Onset  . Hypertension Father   . Hypertension Sister   . Coronary artery disease Other    History  Substance Use Topics  . Smoking status: Former Research scientist (life sciences)  . Smokeless tobacco: Never Used  . Alcohol Use: No   OB History    Gravida Para Term Preterm AB TAB SAB Ectopic Multiple Living   6 6 6             Review of Systems  Gastrointestinal: Positive for nausea and abdominal pain. Negative for vomiting.  All other systems reviewed and are negative.   Allergies  Review of patient's allergies indicates no known allergies.  Home Medications   Prior to Admission medications   Medication Sig Start Date End Date Taking? Authorizing Provider  amLODipine (NORVASC) 5 MG tablet Take 5 mg by mouth daily.    Historical Provider, MD  atenolol (TENORMIN) 100 MG tablet Take 100 mg  by mouth daily.    Historical Provider, MD  Calcium Carb-Cholecalciferol 600-200 MG-UNIT TABS Take 1 tablet by mouth 2 (two) times daily.    Historical Provider, MD  cloNIDine (CATAPRES) 0.1 MG tablet Take 0.1 mg by mouth 2 (two) times daily.    Historical Provider, MD  docusate sodium (COLACE) 100 MG capsule Take 200 mg by mouth at bedtime.    Historical Provider, MD  hydrochlorothiazide (HYDRODIURIL) 25 MG tablet Take 25 mg by mouth daily.    Historical Provider, MD  quinapril (ACCUPRIL) 40 MG tablet Take 40 mg by mouth daily.    Historical Provider, MD  tamsulosin (FLOMAX) 0.4 MG CAPS capsule Take 0.4 mg by mouth daily.    Historical Provider, MD   BP 155/91 mmHg  Pulse 83  Temp(Src) 99.4 F (37.4 C) (Oral)  Resp 18  Ht 5' (1.524 m)  Wt 143 lb (64.864 kg)  BMI 27.93 kg/m2  SpO2 96%   Physical Exam  Constitutional: She is oriented to person, place, and time. She appears well-developed and well-nourished. No distress.  HENT:  Head: Normocephalic and atraumatic.  Eyes: Conjunctivae and EOM are normal.  Neck: Neck supple. No tracheal deviation present.  Cardiovascular: Normal rate, regular rhythm and normal heart sounds.   Pulmonary/Chest: Effort normal and breath sounds normal. No  respiratory distress. She has no wheezes. She has no rales.  Abdominal: Bowel sounds are increased. There is tenderness. No hernia.  fullnes in RUQ, hyperactive bowel sounds mostly in RUQ.   Musculoskeletal: Normal range of motion.  Mild bilateral pitting edema.   Neurological: She is alert and oriented to person, place, and time.  Skin: Skin is warm and dry.  Psychiatric: She has a normal mood and affect. Her behavior is normal.  Nursing note and vitals reviewed.   ED Course  Procedures (including critical care time) Labs Review Labs Reviewed - No data to display  Imaging Review No results found.   EKG Interpretation None      MDM   Final diagnoses:  None    Patient with recent small  bowel obstruction. Left hospital around 4 hours prior to coming back. Has had increased pain and increased swelling in her abdomen. X-ray shows possible return of bowel obstruction. NG tube placed and patient be admitted.  I personally performed the services described in this documentation, which was scribed in my presence. The recorded information has been reviewed and is accurate.       Davonna Belling, MD 10/17/14 [phone]

## 2014-10-16 NOTE — ED Notes (Signed)
Patient was D/C'd today from [facility] being treated for SBO. Patient c/o abdominal pain and swelling to the right side. Also c/o nausea.

## 2014-10-17 DIAGNOSIS — K5669 Other intestinal obstruction: Secondary | ICD-10-CM

## 2014-10-17 LAB — COMPREHENSIVE METABOLIC PANEL
ALT: 9 U/L (ref 0–35)
AST: 16 U/L (ref 0–37)
Albumin: 2.9 g/dL — ABNORMAL LOW (ref 3.5–5.2)
Alkaline Phosphatase: 45 U/L (ref 39–117)
Anion gap: 8 (ref 5–15)
BILIRUBIN TOTAL: 0.7 mg/dL (ref 0.3–1.2)
BUN: 12 mg/dL (ref 6–23)
CHLORIDE: 109 mmol/L (ref 96–112)
CO2: 27 mmol/L (ref 19–32)
CREATININE: 0.68 mg/dL (ref 0.50–1.10)
Calcium: 8.1 mg/dL — ABNORMAL LOW (ref 8.4–10.5)
GFR calc Af Amer: 84 mL/min — ABNORMAL LOW (ref >90)
GFR calc non Af Amer: 73 mL/min — ABNORMAL LOW (ref >90)
GLUCOSE: 106 mg/dL — AB (ref 70–99)
POTASSIUM: 3.7 mmol/L (ref 3.5–5.1)
Sodium: 144 mmol/L (ref 135–145)
Total Protein: 5.7 g/dL — ABNORMAL LOW (ref 6.0–8.3)

## 2014-10-17 LAB — CBC WITH DIFFERENTIAL/PLATELET
BASOS ABS: 0 10*3/uL (ref 0.0–0.1)
BASOS PCT: 0 % (ref 0–1)
EOS ABS: 0 10*3/uL (ref 0.0–0.7)
Eosinophils Relative: 0 % (ref 0–5)
HEMATOCRIT: 38.9 % (ref 36.0–46.0)
HEMOGLOBIN: 12.5 g/dL (ref 12.0–15.0)
LYMPHS ABS: 1.1 10*3/uL (ref 0.7–4.0)
Lymphocytes Relative: 14 % (ref 12–46)
MCH: 29.8 pg (ref 26.0–34.0)
MCHC: 32.1 g/dL (ref 30.0–36.0)
MCV: 92.6 fL (ref 78.0–100.0)
MONO ABS: 1 10*3/uL (ref 0.1–1.0)
MONOS PCT: 13 % — AB (ref 3–12)
Neutro Abs: 5.5 10*3/uL (ref 1.7–7.7)
Neutrophils Relative %: 73 % (ref 43–77)
PLATELETS: 164 10*3/uL (ref 150–400)
RBC: 4.2 MIL/uL (ref 3.87–5.11)
RDW: 12.9 % (ref 11.5–15.5)
WBC: 7.5 10*3/uL (ref 4.0–10.5)

## 2014-10-17 LAB — MAGNESIUM: MAGNESIUM: 1.7 mg/dL (ref 1.5–2.5)

## 2014-10-17 MED ORDER — POTASSIUM CHLORIDE IN NACL 20-0.9 MEQ/L-% IV SOLN
INTRAVENOUS | Status: DC
  Administered 2014-10-17 – 2014-10-18 (×3): via INTRAVENOUS

## 2014-10-17 MED ORDER — MILK AND MOLASSES ENEMA
1.0000 | Freq: Once | RECTAL | Status: AC
  Administered 2014-10-17: 250 mL via RECTAL

## 2014-10-17 MED ORDER — HEPARIN SODIUM (PORCINE) 5000 UNIT/ML IJ SOLN
5000.0000 [IU] | Freq: Three times a day (TID) | INTRAMUSCULAR | Status: DC
Start: 2014-10-17 — End: 2014-10-21
  Administered 2014-10-17 – 2014-10-21 (×13): 5000 [IU] via SUBCUTANEOUS
  Filled 2014-10-17 (×13): qty 1

## 2014-10-17 MED ORDER — METOPROLOL TARTRATE 1 MG/ML IV SOLN
5.0000 mg | INTRAVENOUS | Status: DC
  Administered 2014-10-17 – 2014-10-19 (×11): 5 mg via INTRAVENOUS
  Filled 2014-10-17 (×12): qty 5

## 2014-10-17 MED ORDER — CHLORHEXIDINE GLUCONATE 0.12 % MT SOLN
15.0000 mL | Freq: Two times a day (BID) | OROMUCOSAL | Status: DC
  Administered 2014-10-17 – 2014-10-20 (×6): 15 mL via OROMUCOSAL
  Filled 2014-10-17 (×6): qty 15

## 2014-10-17 MED ORDER — ONDANSETRON HCL 4 MG/2ML IJ SOLN: 4.0000 mg | Freq: Four times a day (QID) | INTRAMUSCULAR | Status: DC | PRN

## 2014-10-17 MED ORDER — LIDOCAINE VISCOUS 2 % MT SOLN
OROMUCOSAL | Status: AC
  Filled 2014-10-17: qty 15

## 2014-10-17 MED ORDER — FAMOTIDINE IN NACL 20-0.9 MG/50ML-% IV SOLN
20.0000 mg | INTRAVENOUS | Status: DC
  Administered 2014-10-17 – 2014-10-20 (×4): 20 mg via INTRAVENOUS
  Filled 2014-10-17 (×5): qty 50

## 2014-10-17 MED ORDER — HYDRALAZINE HCL 20 MG/ML IJ SOLN
5.0000 mg | Freq: Four times a day (QID) | INTRAMUSCULAR | Status: DC | PRN
  Administered 2014-10-17: 10 mg via INTRAVENOUS
  Filled 2014-10-17: qty 1

## 2014-10-17 MED ORDER — CLONIDINE HCL 0.2 MG/24HR TD PTWK
0.2000 mg | MEDICATED_PATCH | TRANSDERMAL | Status: DC
  Administered 2014-10-17: 0.2 mg via TRANSDERMAL
  Filled 2014-10-17: qty 1

## 2014-10-17 MED ORDER — CETYLPYRIDINIUM CHLORIDE 0.05 % MT LIQD
7.0000 mL | Freq: Two times a day (BID) | OROMUCOSAL | Status: DC
  Administered 2014-10-17 – 2014-10-20 (×7): 7 mL via OROMUCOSAL

## 2014-10-17 MED ORDER — FAMOTIDINE IN NACL 20-0.9 MG/50ML-% IV SOLN
INTRAVENOUS | Status: AC
  Filled 2014-10-17: qty 50

## 2014-10-17 MED ORDER — ONDANSETRON HCL 4 MG PO TABS: 4.0000 mg | ORAL_TABLET | Freq: Four times a day (QID) | ORAL | Status: DC | PRN

## 2014-10-17 NOTE — Progress Notes (Signed)
Patient examined and chart reviewed.   Assessment/Plan  1. SBO: recurrent. No emesis or diarrhea at home. Xray confirms partial as contrast moving to rectum. NG draining dark greenish drainage. Discussed with general surgery who recommends enema and continued NG.   2. HTN: home meds include 5 antihypertensive agents. Currently BP high end of normal with prn hydralazine provided. Will provide scheduled metoprolol with parameters. Will also consider clonidine patch as concern for rebound. Monitor  PE General: up in chair appears somewhat uncomfortable CV: RRR no MGR no LE edema Resp: normal effort BS clear bilaterally Abdomen: round but soft. NG intact but obviously needing advancing. NG tube advanced per general surgery placement verified with air bolus and immediate return greenish drainage. No guarding or rebounding MS: joints without clubbing or cyanosis.     Lezlie Octave. Earline Stiner, NP

## 2014-10-17 NOTE — Progress Notes (Signed)
  Subjective: Readmission noted. Patient states she had some abdominal cramping, but no nausea or vomiting. An NG tube was placed. Patient denies abdominal pain currently.  Objective: Vital signs in last 24 hours: Temp:  [98.2 F (36.8 C)-99.4 F (37.4 C)] 98.4 F (36.9 C) (03/30 0546) Pulse Rate:  [80-87] 87 (03/30 0546) Resp:  [18] 18 (03/30 0546) BP: (155-196)/(78-93) 159/78 mmHg (03/30 0546) SpO2:  [95 %-97 %] 97 % (03/30 0546) Weight:  [63.594 kg (140 lb 3.2 oz)-64.864 kg (143 lb)] 63.594 kg (140 lb 3.2 oz) (03/30 0103) Last BM Date: 2014-11-11  Intake/Output from previous day: 11/11/2022 0701 - 03/30 0700 In: 392.5 [I.V.:342.5; NG/GT:50] Out: 200 [Urine:200] Intake/Output this shift:    General appearance: alert, cooperative and no distress GI: Soft, not particularly distended. No tenderness noted. Occasional bowel sounds appreciated. NG tube advanced for better placement.  Lab Results:   Recent Labs  11/11/2014 0639 10/17/14 0651  WBC 7.3 7.5  HGB 12.1 12.5  HCT 38.0 38.9  PLT 160 164   BMET  Recent Labs  2014-11-11 0639 10/17/14 0651  NA 145 144  K 3.4* 3.7  CL 108 109  CO2 29 27  GLUCOSE 104* 106*  BUN 15 12  CREATININE 0.75 0.68  CALCIUM 7.9* 8.1*   PT/INR No results for input(s): LABPROT, INR in the last 72 hours.  Studies/Results: Dg Abd 2 Views  2014-11-11   CLINICAL DATA:  81 year old female with abdominal pain. Recent hospitalization and discharge for small bowel obstruction.  EXAM: ABDOMEN - 2 VIEW  COMPARISON:  Abdominal radiograph 10/14/2014, CT 10/13/2014  FINDINGS: Increase gaseous distention of small bowel loops in the central abdomen, small bowel dilatation up to 5 cm. There is oral contrast in the descending colon and rectum from prior CT. Mild gaseous gastric distension with air-fluid level. No free intra-abdominal air.  IMPRESSION: Progressive gaseous distention of small bowel and stomach, concerning for recurrent small bowel obstruction. There  is oral contrast in the descending colon and rectum from prior CT.   Electronically Signed   By: Jeb Levering M.D.   On: Nov 11, 2014 22:54    Anti-infectives: Anti-infectives    None      Assessment/Plan: Impression: Recurrent abdominal pain. Patient's x-rays show contrast in the colon. There was some gaseous distention of the stomach and small bowel. Patient may still have a component of a partial small bowel obstruction. I am trying to avoid surgery given her age and comorbidities. An enema has been ordered. We'll continue NG tube decompression for 24 hours.  LOS: 1 day    Katie Benitez A 10/17/2014

## 2014-10-17 NOTE — Care Management Note (Addendum)
    Page 1 of 2   10/19/2014     11:48:30 AM CARE MANAGEMENT NOTE 10/19/2014  Patient:  Katie Benitez,Katie Benitez   Account Number:  [id number]  Date Initiated:  10/17/2014  Documentation initiated by:  Theophilus Kinds  Subjective/Objective Assessment:   Pt admitted from home with recurrent SBO. Pt lives with her daughter and will return home at discharge. Pt has a rollator and a new wheelchair. Pt was set up with [facility] for PT services.     Action/Plan:   Will continue to follow for discharge planning needs.   Anticipated DC Date:  10/22/2014   Anticipated DC Plan:  [place]  CM consult      [facility] Choice  Resumption Of Svcs/PTA Provider   Choice offered to / List presented to:  C-4 Adult Children        HH arranged  HH-2 PT      [place]   Status of service:  Completed, signed off Medicare Important Message given?  YES (If response is "NO", the following Medicare IM given date fields will be blank) Date Medicare IM given:  10/19/2014 Medicare IM given by:  Theophilus Kinds Date Additional Medicare IM given:   Additional Medicare IM given by:    Discharge Disposition:  [place]  Per UR Regulation:    If discussed at Long Length of Stay Meetings, dates discussed:    Comments:  10/19/14 [place], RN BSN CM Pt NG out and pt eating clear liquids. Pt will discharge once able to tolerate solid foods and bowels moving. Will resume [place] PT with [facility] at discharge per family request. No DME needs noted. If pt discharges over the weekend, staff to call [facility] to notify them. Pt and pts nurse aware of discharge arrangements.  10/17/14 [place], RN BSN CM

## 2014-10-17 NOTE — ED Notes (Signed)
Report given to [facility] on 300

## 2014-10-17 NOTE — Progress Notes (Addendum)
Patient/Family oriented to room. Information packet given to patient/family. Admission inpatient armband information verified with patient/family to include name and date of birth and placed on patient arm. Side rails up x 2, fall assessment and education completed with patient/family. Call light within reach. Patient/family able to voice and demonstrate understanding of unit orientation instructions  

## 2014-10-17 NOTE — Progress Notes (Signed)
UR chart review completed.  

## 2014-10-17 NOTE — H&P (Signed)
Hospitalist Admission History and Physical  Patient name: Katie Benitez Medical record number: [id number] Date of birth: 1919-05-05 Age: 81 y.o. Gender: female  Primary Care Provider: Bronson Curb, PA-C  Chief Complaint: SBO   History of Present Illness:This is a 81 y.o. year old female with significant past medical history of HTN, TIA, diverticulosis presenting with SBO. Pt noted to have been discharged from the hospital yesterday for SBO. Was evaluated by surgery. Had NGT placed w/ symptomatic improvement. Pt was able to eat prior to discharge. Per report, pt had progressive worsening of sxs upon returning home. Per daughter, she feels pt was rushing to come back home in hindsight. No noted emesis or diarrhea. Predominant nausea and decreased po intake.  Presented back to the ER afebrile, hemodynamically stable. CBC and CMET WNL apart from K 3.4. F/u KUB shows Progressive gaseous distention of small bowel and stomach, concerning for recurrent small bowel obstruction, Noted retained contrast in descending colon from previous CT. NGT placed at bedside.   Assessment and Plan:  Active Problems:   Small bowel obstruction   SBO (small bowel obstruction)   1-SBO  -recurrence -NGT  -NPO  -pain control  -consider surgery consult as clinically indicated  2- HTN -BP stable  -IV hydralazine prn  -follow  FEN/GI: NPO  Prophylaxis: sub q heparin  Disposition: pending further evaluation  Code Status:Full Code    Patient Active Problem List   Diagnosis Date Noted  . Leukocytosis 10/15/2014  . Small bowel obstruction 10/13/2014  . HTN (hypertension) 10/13/2014  . SBO (small bowel obstruction) 10/13/2014   Past Medical History: Past Medical History  Diagnosis Date  . Diverticulitis   . Hypertension   . TIA (transient ischemic attack)   . Glaucoma   . Cancer     uterine  . SBO (small bowel obstruction)     Past Surgical History: Past Surgical History  Procedure  Laterality Date  . Abdominal hysterectomy    . Glaucoma surgery      Social History: History   Social History  . Marital Status: Widowed    Spouse Name: N/A  . Number of Children: N/A  . Years of Education: N/A   Social History Main Topics  . Smoking status: Former Research scientist (life sciences)  . Smokeless tobacco: Never Used  . Alcohol Use: No  . Drug Use: No  . Sexual Activity: Not Currently   Other Topics Concern  . None   Social History Narrative    Family History: Family History  Problem Relation Age of Onset  . Hypertension Father   . Hypertension Sister   . Coronary artery disease Other     Allergies: No Known Allergies  Current Facility-Administered Medications  Medication Dose Route Frequency Provider Last Rate Last Dose  . 0.9 % NaCl with KCl 20 mEq/ L  infusion   Intravenous Continuous Deneise Lever, MD      . heparin injection 5,000 Units  5,000 Units Subcutaneous 3 times per day Deneise Lever, MD      . lidocaine (XYLOCAINE) 2 % viscous mouth solution           . ondansetron (ZOFRAN) tablet 4 mg  4 mg Oral Q6H PRN Deneise Lever, MD       Or  . ondansetron [facility]) injection 4 mg  4 mg Intravenous Q6H PRN Deneise Lever, MD       Current Outpatient Prescriptions  Medication Sig Dispense Refill  . acetaminophen (TYLENOL) 500 MG tablet Take  500 mg by mouth every 6 (six) hours as needed for mild pain or moderate pain.    Marland Kitchen amLODipine (NORVASC) 5 MG tablet Take 5 mg by mouth daily.    Marland Kitchen atenolol (TENORMIN) 100 MG tablet Take 100 mg by mouth 2 (two) times daily.     . Calcium Carb-Cholecalciferol 600-200 MG-UNIT TABS Take 1 tablet by mouth 2 (two) times daily.    . cloNIDine (CATAPRES) 0.1 MG tablet Take 0.1 mg by mouth 2 (two) times daily.    Marland Kitchen docusate sodium (COLACE) 100 MG capsule Take 200 mg by mouth at bedtime.    . enalapril (VASOTEC) 20 MG tablet Take 40 mg by mouth daily.    . hydrochlorothiazide (HYDRODIURIL) 25 MG tablet Take 25 mg by mouth daily.    .  tamsulosin (FLOMAX) 0.4 MG CAPS capsule Take 0.4 mg by mouth daily.     Review Of Systems: 12 point ROS negative except as noted above in HPI.  Physical Exam: Filed Vitals:   10/16/14 2000  BP: 155/91  Pulse: 83  Temp: 99.4 F (37.4 C)  Resp: 18    General: alert and cooperative HEENT: PERRLA and extra ocular movement intact Heart: S1, S2 normal, no murmur, rub or gallop, regular rate and rhythm Lungs: clear to auscultation, no wheezes or rales and unlabored breathing Abdomen: abdomen is soft without significant tenderness, masses, organomegaly or guarding Extremities: extremities normal, atraumatic, no cyanosis or edema Skin:no rashes Neurology: normal without focal findings  Labs and Imaging: Lab Results  Component Value Date/Time   NA 145 10/16/2014 06:39 AM   K 3.4* 10/16/2014 06:39 AM   CL 108 10/16/2014 06:39 AM   CO2 29 10/16/2014 06:39 AM   BUN 15 10/16/2014 06:39 AM   CREATININE 0.75 10/16/2014 06:39 AM   GLUCOSE 104* 10/16/2014 06:39 AM   Lab Results  Component Value Date   WBC 7.3 10/16/2014   HGB 12.1 10/16/2014   HCT 38.0 10/16/2014   MCV 93.6 10/16/2014   PLT 160 10/16/2014    Dg Abd 2 Views  10/16/2014   CLINICAL DATA:  81 year old female with abdominal pain. Recent hospitalization and discharge for small bowel obstruction.  EXAM: ABDOMEN - 2 VIEW  COMPARISON:  Abdominal radiograph 10/14/2014, CT 10/13/2014  FINDINGS: Increase gaseous distention of small bowel loops in the central abdomen, small bowel dilatation up to 5 cm. There is oral contrast in the descending colon and rectum from prior CT. Mild gaseous gastric distension with air-fluid level. No free intra-abdominal air.  IMPRESSION: Progressive gaseous distention of small bowel and stomach, concerning for recurrent small bowel obstruction. There is oral contrast in the descending colon and rectum from prior CT.   Electronically Signed   By: Jeb Levering M.D.   On: 10/16/2014 22:54            Shanda Howells MD  Pager: [phone]

## 2014-10-18 ENCOUNTER — Inpatient Hospital Stay (HOSPITAL_COMMUNITY): Payer: Medicare Other

## 2014-10-18 DIAGNOSIS — E870 Hyperosmolality and hypernatremia: Secondary | ICD-10-CM

## 2014-10-18 DIAGNOSIS — I10 Essential (primary) hypertension: Secondary | ICD-10-CM

## 2014-10-18 LAB — COMPREHENSIVE METABOLIC PANEL
ALT: 8 U/L (ref 0–35)
AST: 15 U/L (ref 0–37)
Albumin: 2.9 g/dL — ABNORMAL LOW (ref 3.5–5.2)
Alkaline Phosphatase: 39 U/L (ref 39–117)
Anion gap: 8 (ref 5–15)
BUN: 13 mg/dL (ref 6–23)
CALCIUM: 8 mg/dL — AB (ref 8.4–10.5)
CO2: 27 mmol/L (ref 19–32)
CREATININE: 0.75 mg/dL (ref 0.50–1.10)
Chloride: 111 mmol/L (ref 96–112)
GFR calc non Af Amer: 70 mL/min — ABNORMAL LOW (ref >90)
GFR, EST AFRICAN AMERICAN: 81 mL/min — AB (ref >90)
GLUCOSE: 85 mg/dL (ref 70–99)
Potassium: 3.9 mmol/L (ref 3.5–5.1)
Sodium: 146 mmol/L — ABNORMAL HIGH (ref 135–145)
Total Bilirubin: 1.1 mg/dL (ref 0.3–1.2)
Total Protein: 5.4 g/dL — ABNORMAL LOW (ref 6.0–8.3)

## 2014-10-18 LAB — CBC WITH DIFFERENTIAL/PLATELET
Basophils Absolute: 0 10*3/uL (ref 0.0–0.1)
Basophils Relative: 0 % (ref 0–1)
Eosinophils Absolute: 0 10*3/uL (ref 0.0–0.7)
Eosinophils Relative: 0 % (ref 0–5)
HCT: 37.2 % (ref 36.0–46.0)
HEMOGLOBIN: 12 g/dL (ref 12.0–15.0)
Lymphocytes Relative: 15 % (ref 12–46)
Lymphs Abs: 0.9 10*3/uL (ref 0.7–4.0)
MCH: 30.1 pg (ref 26.0–34.0)
MCHC: 32.3 g/dL (ref 30.0–36.0)
MCV: 93.2 fL (ref 78.0–100.0)
MONO ABS: 0.7 10*3/uL (ref 0.1–1.0)
Monocytes Relative: 11 % (ref 3–12)
NEUTROS ABS: 4.3 10*3/uL (ref 1.7–7.7)
Neutrophils Relative %: 74 % (ref 43–77)
Platelets: 166 10*3/uL (ref 150–400)
RBC: 3.99 MIL/uL (ref 3.87–5.11)
RDW: 13.1 % (ref 11.5–15.5)
WBC: 5.9 10*3/uL (ref 4.0–10.5)

## 2014-10-18 MED ORDER — PHENOL 1.4 % MT LIQD
1.0000 | OROMUCOSAL | Status: DC | PRN
  Administered 2014-10-18: 1 via OROMUCOSAL
  Filled 2014-10-18: qty 177

## 2014-10-18 MED ORDER — POTASSIUM CHLORIDE IN NACL 20-0.45 MEQ/L-% IV SOLN
INTRAVENOUS | Status: DC
  Administered 2014-10-18 – 2014-10-19 (×3): via INTRAVENOUS
  Administered 2014-10-19: 70 mL/h via INTRAVENOUS
  Administered 2014-10-20 – 2014-10-21 (×2): via INTRAVENOUS
  Filled 2014-10-18 (×11): qty 1000

## 2014-10-18 NOTE — Progress Notes (Signed)
  Subjective: No abdominal pain. Did have a bowel movement yesterday.  Objective: Vital signs in last 24 hours: Temp:  [98.2 F (36.8 C)-98.5 F (36.9 C)] 98.4 F (36.9 C) (03/31 0607) Pulse Rate:  [86-90] 86 (03/31 0607) Resp:  [18] 18 (03/31 0607) BP: (149-174)/(77-92) 166/77 mmHg (03/31 0607) SpO2:  [92 %-93 %] 92 % (03/31 0607) Last BM Date: 10/17/14  Intake/Output from previous day: 03/30 0701 - 03/31 0700 In: 2555 [I.V.:1855; NG/GT:650; IV Piggyback:50] Out: 200 [Urine:200] Intake/Output this shift:    General appearance: alert, cooperative and no distress GI: soft, non-tender; bowel sounds normal; no masses,  no organomegaly  Lab Results:   Recent Labs  10/17/14 0651 10/18/14 0636  WBC 7.5 5.9  HGB 12.5 12.0  HCT 38.9 37.2  PLT 164 166   BMET  Recent Labs  10/17/14 0651 10/18/14 0636  NA 144 146*  K 3.7 3.9  CL 109 111  CO2 27 27  GLUCOSE 106* 85  BUN 12 13  CREATININE 0.68 0.75  CALCIUM 8.1* 8.0*   PT/INR No results for input(s): LABPROT, INR in the last 72 hours.  Studies/Results: Dg Abd 2 Views  11/01/14   CLINICAL DATA:  81 year old female with abdominal pain. Recent hospitalization and discharge for small bowel obstruction.  EXAM: ABDOMEN - 2 VIEW  COMPARISON:  Abdominal radiograph 10/14/2014, CT 10/13/2014  FINDINGS: Increase gaseous distention of small bowel loops in the central abdomen, small bowel dilatation up to 5 cm. There is oral contrast in the descending colon and rectum from prior CT. Mild gaseous gastric distension with air-fluid level. No free intra-abdominal air.  IMPRESSION: Progressive gaseous distention of small bowel and stomach, concerning for recurrent small bowel obstruction. There is oral contrast in the descending colon and rectum from prior CT.   Electronically Signed   By: Jeb Levering M.D.   On: November 01, 2014 22:54    Anti-infectives: Anti-infectives    None      Assessment/Plan: Impression: Partial small  bowel obstruction, slowly resolving Plan: Would continue NG tube decompression for 24 hours as she did have increased NG tube output after repositioning of the tube. Will reassess in a.m.  LOS: 2 days    Ethell Blatchford A 10/18/2014

## 2014-10-18 NOTE — Progress Notes (Signed)
[facility] PROGRESS NOTE  Katie Benitez GNF:[id number] DOB: [birth year] DOA: 10/16/2014 PCP: Bronson Curb, PA-C  Assessment/Plan: 1. SBO: recurrent. Xray confirms partial as contrast moving to rectum. NG draining large amount dark greenish drainage. General surgery following and recommending continue NG tube. Enema yesterday with fair results.   2. HTN: home meds include 5 antihypertensive agents. Improved somewhat with scheduled metoprolol and clonidine patch. Will continue to monitor closely and adjust medication as indicated  3. Hypernatremia: mild. surprising given NG drainage. Will monitor closely. If no improvement tomorrow consider changing fluid to .1/2 NS.   Code Status: full Family Communication: daughter at bedside Disposition Plan: to be determined   Consultants:  General surgery  Procedures:  none  Antibiotics:  none  HPI/Subjective: Lying in bed. Denies pain/discomfort.   Objective: Filed Vitals:   10/18/14 0607  BP: 166/77  Pulse: 86  Temp: 98.4 F (36.9 C)  Resp: 18    Intake/Output Summary (Last 24 hours) at 10/18/14 1057 Last data filed at 10/18/14 0912  Gross per 24 hour  Intake   2555 ml  Output    450 ml  Net   2105 ml   Filed Weights   10/16/14 2000 10/17/14 0103  Weight: 64.864 kg (143 lb) 63.594 kg (140 lb 3.2 oz)    Exam:   General:  Well nourished appears comfortable  Cardiovascular: RRR no MGR no LE edema  Respiratory: normal effort but somewhat shallow respirations. BS distant but clear no wheeze   Abdomen: flat soft sluggish BS non-tender to palpation no rebounding.   Musculoskeletal: joints without swelling/erythema. Moves all extremities  Data Reviewed: Basic Metabolic Panel:  Recent Labs Lab 10/13/14 0800 10/14/14 0654 10/16/14 0639 10/16/14 2100 10/17/14 0651 10/18/14 0636  NA 137 143 145  --  144 146*  K 3.5 3.5 3.4*  --  3.7 3.9  CL 97 102 108  --  109 111  CO2 31 33* 29  --  27 27   GLUCOSE 121* 122* 104*  --  106* 85  BUN 21 24* 15  --  12 13  CREATININE 0.95 0.96 0.75  --  0.68 0.75  CALCIUM 9.3 8.4 7.9*  --  8.1* 8.0*  MG  --   --   --  1.7  --   --    Liver Function Tests:  Recent Labs Lab 10/13/14 0800 10/17/14 0651 10/18/14 0636  AST 18 16 15   ALT 12 9 8   ALKPHOS 64 45 39  BILITOT 0.7 0.7 1.1  PROT 7.1 5.7* 5.4*  ALBUMIN 3.8 2.9* 2.9*   No results for input(s): LIPASE, AMYLASE in the last 168 hours. No results for input(s): AMMONIA in the last 168 hours. CBC:  Recent Labs Lab 10/13/14 0800 10/14/14 0654 10/16/14 0639 10/17/14 0651 10/18/14 0636  WBC 6.0 11.5* 7.3 7.5 5.9  NEUTROABS 4.9  --   --  5.5 4.3  HGB 12.5 12.8 12.1 12.5 12.0  HCT 38.4 39.8 38.0 38.9 37.2  MCV 93.0 93.2 93.6 92.6 93.2  PLT 183 187 160 164 166   Cardiac Enzymes: No results for input(s): CKTOTAL, CKMB, CKMBINDEX, TROPONINI in the last 168 hours. BNP (last 3 results) No results for input(s): BNP in the last 8760 hours.  ProBNP (last 3 results) No results for input(s): PROBNP in the last 8760 hours.  CBG: No results for input(s): GLUCAP in the last 168 hours.  No results found for this or any previous visit (from the past 240 hour(s)).  Studies: Dg Abd 2 Views  10/16/2014   CLINICAL DATA:  81 year old female with abdominal pain. Recent hospitalization and discharge for small bowel obstruction.  EXAM: ABDOMEN - 2 VIEW  COMPARISON:  Abdominal radiograph 10/14/2014, CT 10/13/2014  FINDINGS: Increase gaseous distention of small bowel loops in the central abdomen, small bowel dilatation up to 5 cm. There is oral contrast in the descending colon and rectum from prior CT. Mild gaseous gastric distension with air-fluid level. No free intra-abdominal air.  IMPRESSION: Progressive gaseous distention of small bowel and stomach, concerning for recurrent small bowel obstruction. There is oral contrast in the descending colon and rectum from prior CT.   Electronically Signed    By: Jeb Levering M.D.   On: 10/16/2014 22:54    Scheduled Meds: . antiseptic oral rinse  7 mL Mouth Rinse q12n4p  . chlorhexidine  15 mL Mouth Rinse BID  . cloNIDine  0.2 mg Transdermal Weekly  . famotidine (PEPCID) IV  20 mg Intravenous Q24H  . heparin  5,000 Units Subcutaneous 3 times per day  . metoprolol  5 mg Intravenous Q4H   Continuous Infusions: . 0.9 % NaCl with KCl 20 mEq / L 75 mL/hr at 10/18/14 6222    Principal Problem:   Small bowel obstruction Active Problems:   HTN (hypertension)   SBO (small bowel obstruction)   Hypernatremia    Time spent: 35 minutes    [place] [facility] Pager [phone]. If 7PM-7AM, please contact night-coverage at [email or web address], password [facility] 10/18/2014, 10:57 AM  LOS: 2 days

## 2014-10-19 LAB — COMPREHENSIVE METABOLIC PANEL
ALBUMIN: 2.9 g/dL — AB (ref 3.5–5.2)
ALT: 8 U/L (ref 0–35)
AST: 16 U/L (ref 0–37)
Alkaline Phosphatase: 40 U/L (ref 39–117)
Anion gap: 10 (ref 5–15)
BUN: 13 mg/dL (ref 6–23)
CALCIUM: 7.8 mg/dL — AB (ref 8.4–10.5)
CO2: 23 mmol/L (ref 19–32)
CREATININE: 0.73 mg/dL (ref 0.50–1.10)
Chloride: 109 mmol/L (ref 96–112)
GFR calc non Af Amer: 71 mL/min — ABNORMAL LOW (ref >90)
GFR, EST AFRICAN AMERICAN: 82 mL/min — AB (ref >90)
GLUCOSE: 69 mg/dL — AB (ref 70–99)
POTASSIUM: 3.9 mmol/L (ref 3.5–5.1)
Sodium: 142 mmol/L (ref 135–145)
Total Bilirubin: 1.3 mg/dL — ABNORMAL HIGH (ref 0.3–1.2)
Total Protein: 5.4 g/dL — ABNORMAL LOW (ref 6.0–8.3)

## 2014-10-19 LAB — CBC WITH DIFFERENTIAL/PLATELET
BASOS ABS: 0 10*3/uL (ref 0.0–0.1)
Basophils Relative: 0 % (ref 0–1)
Eosinophils Absolute: 0.1 10*3/uL (ref 0.0–0.7)
Eosinophils Relative: 2 % (ref 0–5)
HCT: 36.9 % (ref 36.0–46.0)
HEMOGLOBIN: 11.8 g/dL — AB (ref 12.0–15.0)
Lymphocytes Relative: 16 % (ref 12–46)
Lymphs Abs: 1.1 10*3/uL (ref 0.7–4.0)
MCH: 29.8 pg (ref 26.0–34.0)
MCHC: 32 g/dL (ref 30.0–36.0)
MCV: 93.2 fL (ref 78.0–100.0)
MONOS PCT: 12 % (ref 3–12)
Monocytes Absolute: 0.8 10*3/uL (ref 0.1–1.0)
NEUTROS ABS: 4.5 10*3/uL (ref 1.7–7.7)
NEUTROS PCT: 70 % (ref 43–77)
Platelets: 168 10*3/uL (ref 150–400)
RBC: 3.96 MIL/uL (ref 3.87–5.11)
RDW: 13 % (ref 11.5–15.5)
WBC: 6.5 10*3/uL (ref 4.0–10.5)

## 2014-10-19 MED ORDER — ATENOLOL 25 MG PO TABS: 100.0000 mg | ORAL_TABLET | Freq: Two times a day (BID) | ORAL | Status: DC

## 2014-10-19 MED ORDER — DIPHENHYDRAMINE HCL 50 MG/ML IJ SOLN
12.5000 mg | Freq: Once | INTRAMUSCULAR | Status: AC
  Administered 2014-10-19: 12.5 mg via INTRAVENOUS
  Filled 2014-10-19: qty 1

## 2014-10-19 MED ORDER — POLYETHYLENE GLYCOL 3350 17 G PO PACK
17.0000 g | PACK | Freq: Every day | ORAL | Status: DC
  Administered 2014-10-19 – 2014-10-21 (×3): 17 g via ORAL
  Filled 2014-10-19 (×3): qty 1

## 2014-10-19 MED ORDER — ATENOLOL 25 MG PO TABS
100.0000 mg | ORAL_TABLET | Freq: Two times a day (BID) | ORAL | Status: DC
  Administered 2014-10-19 – 2014-10-21 (×5): 100 mg via ORAL
  Filled 2014-10-19 (×5): qty 4

## 2014-10-19 MED ORDER — AMLODIPINE BESYLATE 5 MG PO TABS
5.0000 mg | ORAL_TABLET | Freq: Every day | ORAL | Status: DC
  Administered 2014-10-19 – 2014-10-21 (×3): 5 mg via ORAL
  Filled 2014-10-19 (×3): qty 1

## 2014-10-19 NOTE — Progress Notes (Signed)
[facility] PROGRESS NOTE  Katie Benitez OMB:[id number] DOB: 09/28/1918 DOA: 10/16/2014 PCP: Bronson Curb, PA-C  Assessment/Plan: 1. SBO: recurrent. Repeat xray 3/31 reveal some improvement.  NG removed per general surgery. Clear liquid diet provided. Miralax started as well.    2. HTN: fair control. Will continue clonidine patch and resume home norvasc 5mg  and atenolol 100mg  BID with parameters. Continue prn hydralazine. Home meds include HCTZ and vasotec. Will hold these for now.   3. Hypernatremia: resolved with IV fluid change. monitor   Code Status: full Family Communication: daughter at bedside Disposition Plan: home when ready   Consultants:  General surgery  Procedures:  NG tube  Antibiotics:  none  HPI/Subjective: Sitting up in chair smiling. Denies pain discomfort. Is taking clear liquids "carefully". Reports passing flatus  Objective: Filed Vitals:   10/19/14 0537  BP: 165/90  Pulse: 80  Temp: 98.7 F (37.1 C)  Resp: 20    Intake/Output Summary (Last 24 hours) at 10/19/14 1339 Last data filed at 10/19/14 1101  Gross per 24 hour  Intake 1564.33 ml  Output   1025 ml  Net 539.33 ml   Filed Weights   10/16/14 2000 10/17/14 0103  Weight: 64.864 kg (143 lb) 63.594 kg (140 lb 3.2 oz)    Exam:   General:  Well nourished appears comfortable  Cardiovascular: RRR no MGR no LE edema PPP  Respiratory: normal effort BS slightly distant but clear bilaterally to ausculation no wheeze  Abdomen: rounded but soft +BS no guarding   Musculoskeletal: no clubbing or cyanosis   Data Reviewed: Basic Metabolic Panel:  Recent Labs Lab 10/14/14 0654 10/16/14 0639 10/16/14 2100 10/17/14 0651 10/18/14 0636 10/19/14 0707  NA 143 145  --  144 146* 142  K 3.5 3.4*  --  3.7 3.9 3.9  CL 102 108  --  109 111 109  CO2 33* 29  --  27 27 23   GLUCOSE 122* 104*  --  106* 85 69*  BUN 24* 15  --  12 13 13   CREATININE 0.96 0.75  --  0.68 0.75 0.73   CALCIUM 8.4 7.9*  --  8.1* 8.0* 7.8*  MG  --   --  1.7  --   --   --    Liver Function Tests:  Recent Labs Lab 10/13/14 0800 10/17/14 0651 10/18/14 0636 10/19/14 0707  AST 18 16 15 16   ALT 12 9 8 8   ALKPHOS 64 45 39 40  BILITOT 0.7 0.7 1.1 1.3*  PROT 7.1 5.7* 5.4* 5.4*  ALBUMIN 3.8 2.9* 2.9* 2.9*   No results for input(s): LIPASE, AMYLASE in the last 168 hours. No results for input(s): AMMONIA in the last 168 hours. CBC:  Recent Labs Lab 10/13/14 0800 10/14/14 0654 10/16/14 0639 10/17/14 0651 10/18/14 0636 10/19/14 0707  WBC 6.0 11.5* 7.3 7.5 5.9 6.5  NEUTROABS 4.9  --   --  5.5 4.3 4.5  HGB 12.5 12.8 12.1 12.5 12.0 11.8*  HCT 38.4 39.8 38.0 38.9 37.2 36.9  MCV 93.0 93.2 93.6 92.6 93.2 93.2  PLT 183 187 160 164 166 168   Cardiac Enzymes: No results for input(s): CKTOTAL, CKMB, CKMBINDEX, TROPONINI in the last 168 hours. BNP (last 3 results) No results for input(s): BNP in the last 8760 hours.  ProBNP (last 3 results) No results for input(s): PROBNP in the last 8760 hours.  CBG: No results for input(s): GLUCAP in the last 168 hours.  No results found for this or any  previous visit (from the past 240 hour(s)).   Studies: Dg Abd 1 View  10/18/2014   CLINICAL DATA:  NG tube placement 2 days ago. Pulled out and repositioned.  EXAM: ABDOMEN - 1 VIEW  COMPARISON:  10/16/2014  FINDINGS: NG tube tip is in the mid to distal stomach. Mild gaseous distention of small bowel loops compatible with small bowel obstruction. Overall slight this scratch head overall improvement in the small bowel distention since prior study.  IMPRESSION: NG tube tip in the mid to distal stomach. Continued partial small bowel obstruction, slightly improved.   Electronically Signed   By: Rolm Baptise M.D.   On: 10/18/2014 16:03    Scheduled Meds: . amLODipine  5 mg Oral Daily  . antiseptic oral rinse  7 mL Mouth Rinse q12n4p  . atenolol  100 mg Oral BID  . chlorhexidine  15 mL Mouth Rinse BID   . cloNIDine  0.2 mg Transdermal Weekly  . famotidine (PEPCID) IV  20 mg Intravenous Q24H  . heparin  5,000 Units Subcutaneous 3 times per day  . polyethylene glycol  17 g Oral Daily   Continuous Infusions: . 0.45 % NaCl with KCl 20 mEq / L 110 mL/hr at 10/19/14 1100    Principal Problem:   Small bowel obstruction Active Problems:   Essential hypertension   SBO (small bowel obstruction)   Hypernatremia    Time spent: 35 minutes    [place] [facility] Pager [phone]. If 7PM-7AM, please contact night-coverage at [email or web address], password [facility] 10/19/2014, 1:39 PM  LOS: 3 days

## 2014-10-19 NOTE — Progress Notes (Signed)
  Subjective: Denies abdominal pain.  Objective: Vital signs in last 24 hours: Temp:  [98 F (36.7 C)-98.7 F (37.1 C)] 98.7 F (37.1 C) (04/01 0537) Pulse Rate:  [62-83] 80 (04/01 0537) Resp:  [18-22] 20 (04/01 0537) BP: (130-172)/(89-93) 165/90 mmHg (04/01 0537) SpO2:  [96 %-98 %] 96 % (04/01 0537) Last BM Date: 10/17/14  Intake/Output from previous day: 03/31 0701 - 04/01 0700 In: 1564.3 [I.V.:1514.3; IV Piggyback:50] Out: 825 [Urine:650; Emesis/NG output:175] Intake/Output this shift: Total I/O In: -  Out: 300 [Urine:300]  General appearance: cooperative, appears stated age and no distress GI: soft, non-tender; bowel sounds normal; no masses,  no organomegaly  Lab Results:   Recent Labs  10/18/14 0636 10/19/14 0707  WBC 5.9 6.5  HGB 12.0 11.8*  HCT 37.2 36.9  PLT 166 168   BMET  Recent Labs  10/18/14 0636 10/19/14 0707  NA 146* 142  K 3.9 3.9  CL 111 109  CO2 27 23  GLUCOSE 85 69*  BUN 13 13  CREATININE 0.75 0.73  CALCIUM 8.0* 7.8*   PT/INR No results for input(s): LABPROT, INR in the last 72 hours.  Studies/Results: Dg Abd 1 View  10/18/2014   CLINICAL DATA:  NG tube placement 2 days ago. Pulled out and repositioned.  EXAM: ABDOMEN - 1 VIEW  COMPARISON:  10/16/2014  FINDINGS: NG tube tip is in the mid to distal stomach. Mild gaseous distention of small bowel loops compatible with small bowel obstruction. Overall slight this scratch head overall improvement in the small bowel distention since prior study.  IMPRESSION: NG tube tip in the mid to distal stomach. Continued partial small bowel obstruction, slightly improved.   Electronically Signed   By: Rolm Baptise M.D.   On: 10/18/2014 16:03    Anti-infectives: Anti-infectives    None      Assessment/Plan: Impression: Small bowel obstruction, resolving Plan: We'll remove NG tube. Start clear liquid diet. We'll give Miralax.   LOS: 3 days    Sims Laday A 10/19/2014

## 2014-10-19 NOTE — Progress Notes (Signed)
Pt restless. MD paged and orders given for Benadryl 12.5mg  IV to help pt.rest.

## 2014-10-20 LAB — COMPREHENSIVE METABOLIC PANEL
ALK PHOS: 44 U/L (ref 39–117)
ALT: 8 U/L (ref 0–35)
AST: 17 U/L (ref 0–37)
Albumin: 3 g/dL — ABNORMAL LOW (ref 3.5–5.2)
Anion gap: 7 (ref 5–15)
BILIRUBIN TOTAL: 0.9 mg/dL (ref 0.3–1.2)
BUN: 8 mg/dL (ref 6–23)
CO2: 24 mmol/L (ref 19–32)
Calcium: 8 mg/dL — ABNORMAL LOW (ref 8.4–10.5)
Chloride: 104 mmol/L (ref 96–112)
Creatinine, Ser: 0.63 mg/dL (ref 0.50–1.10)
GFR calc Af Amer: 86 mL/min — ABNORMAL LOW (ref >90)
GFR, EST NON AFRICAN AMERICAN: 74 mL/min — AB (ref >90)
Glucose, Bld: 83 mg/dL (ref 70–99)
POTASSIUM: 4 mmol/L (ref 3.5–5.1)
Sodium: 135 mmol/L (ref 135–145)
Total Protein: 5.6 g/dL — ABNORMAL LOW (ref 6.0–8.3)

## 2014-10-20 LAB — CBC WITH DIFFERENTIAL/PLATELET
BASOS ABS: 0 10*3/uL (ref 0.0–0.1)
BASOS PCT: 0 % (ref 0–1)
Eosinophils Absolute: 0.3 10*3/uL (ref 0.0–0.7)
Eosinophils Relative: 4 % (ref 0–5)
HCT: 39.9 % (ref 36.0–46.0)
HEMOGLOBIN: 12.9 g/dL (ref 12.0–15.0)
LYMPHS ABS: 1.1 10*3/uL (ref 0.7–4.0)
LYMPHS PCT: 16 % (ref 12–46)
MCH: 29.7 pg (ref 26.0–34.0)
MCHC: 32.3 g/dL (ref 30.0–36.0)
MCV: 91.7 fL (ref 78.0–100.0)
MONO ABS: 0.7 10*3/uL (ref 0.1–1.0)
MONOS PCT: 11 % (ref 3–12)
Neutro Abs: 4.8 10*3/uL (ref 1.7–7.7)
Neutrophils Relative %: 69 % (ref 43–77)
Platelets: 182 10*3/uL (ref 150–400)
RBC: 4.35 MIL/uL (ref 3.87–5.11)
RDW: 13 % (ref 11.5–15.5)
WBC: 6.9 10*3/uL (ref 4.0–10.5)

## 2014-10-20 MED ORDER — ENALAPRIL MALEATE 5 MG PO TABS
10.0000 mg | ORAL_TABLET | Freq: Every day | ORAL | Status: DC
  Administered 2014-10-21: 10 mg via ORAL
  Filled 2014-10-20: qty 2

## 2014-10-20 NOTE — Progress Notes (Signed)
[facility] PROGRESS NOTE  Katie Benitez TFT:[id number] DOB: 12-08-1918 DOA: 10/16/2014 PCP: Bronson Curb, PA-C  Assessment/Plan: 1. SBO: recurrent. Repeat xray 3/31 reveal some improvement.  NG removed per general surgery. Clear liquid diet was started which she tolerated well. Miralax started as well. She had a bowel movement yesterday. Her diet has now been advanced to soft. Continue supportive treatment. We'll decrease the IV fluids.  2. HTN: fair control. Will continue clonidine patch and resume home norvasc 5mg  and atenolol 100mg  BID with parameters. Continue prn hydralazine. Home meds also include HCTZ and vasotec. Will restart Vasotec tomorrow morning and a smaller dose. Continue to hold hydrochlorothiazide.   3. Hypernatremia: resolved with IV fluid change. monitor   Code Status: full Family Communication: daughter at bedside Disposition Plan: home when ready   Consultants:  General surgery  Procedures:  NG tube inserted on admission; removed on 4/116.  Antibiotics:  none  HPI/Subjective: Patient reports a bowel movement yesterday. She denies abdominal pain, nausea, or vomiting.  Objective: Filed Vitals:   10/20/14 1403  BP: 151/74  Pulse: 66  Temp: 98.1 F (36.7 C)  Resp: 20   oxygen saturation 96% on room air.  Intake/Output Summary (Last 24 hours) at 10/20/14 1543 Last data filed at 10/20/14 1303  Gross per 24 hour  Intake 2640.83 ml  Output   1150 ml  Net 1490.83 ml   Filed Weights   10/16/14 2000 10/17/14 0103  Weight: 64.864 kg (143 lb) 63.594 kg (140 lb 3.2 oz)    Exam:   General:  Pleasant alert 29 year African-American woman in no acute distress.  Cardiovascular: S1, S2, with a soft systolic murmur.  Respiratory: Clear to auscultation bilaterally.  Abdomen: Soft, positive bowel sounds, nontender, nondistended.  Musculoskeletal: no clubbing or cyanosis   Data Reviewed: Basic Metabolic Panel:  Recent Labs Lab  10/16/14 0639 10/16/14 2100 10/17/14 0651 10/18/14 0636 10/19/14 0707 10/20/14 0601  NA 145  --  144 146* 142 135  K 3.4*  --  3.7 3.9 3.9 4.0  CL 108  --  109 111 109 104  CO2 29  --  27 27 23 24   GLUCOSE 104*  --  106* 85 69* 83  BUN 15  --  12 13 13 8   CREATININE 0.75  --  0.68 0.75 0.73 0.63  CALCIUM 7.9*  --  8.1* 8.0* 7.8* 8.0*  MG  --  1.7  --   --   --   --    Liver Function Tests:  Recent Labs Lab 10/17/14 0651 10/18/14 0636 10/19/14 0707 10/20/14 0601  AST 16 15 16 17   ALT 9 8 8 8   ALKPHOS 45 39 40 44  BILITOT 0.7 1.1 1.3* 0.9  PROT 5.7* 5.4* 5.4* 5.6*  ALBUMIN 2.9* 2.9* 2.9* 3.0*   No results for input(s): LIPASE, AMYLASE in the last 168 hours. No results for input(s): AMMONIA in the last 168 hours. CBC:  Recent Labs Lab 10/16/14 0639 10/17/14 0651 10/18/14 0636 10/19/14 0707 10/20/14 0601  WBC 7.3 7.5 5.9 6.5 6.9  NEUTROABS  --  5.5 4.3 4.5 4.8  HGB 12.1 12.5 12.0 11.8* 12.9  HCT 38.0 38.9 37.2 36.9 39.9  MCV 93.6 92.6 93.2 93.2 91.7  PLT 160 164 166 168 182   Cardiac Enzymes: No results for input(s): CKTOTAL, CKMB, CKMBINDEX, TROPONINI in the last 168 hours. BNP (last 3 results) No results for input(s): BNP in the last 8760 hours.  ProBNP (last 3 results)  No results for input(s): PROBNP in the last 8760 hours.  CBG: No results for input(s): GLUCAP in the last 168 hours.  No results found for this or any previous visit (from the past 240 hour(s)).   Studies: No results found.  Scheduled Meds: . amLODipine  5 mg Oral Daily  . antiseptic oral rinse  7 mL Mouth Rinse q12n4p  . atenolol  100 mg Oral BID  . chlorhexidine  15 mL Mouth Rinse BID  . cloNIDine  0.2 mg Transdermal Weekly  . famotidine (PEPCID) IV  20 mg Intravenous Q24H  . heparin  5,000 Units Subcutaneous 3 times per day  . polyethylene glycol  17 g Oral Daily   Continuous Infusions: . 0.45 % NaCl with KCl 20 mEq / L 70 mL/hr at 10/20/14 1347    Principal Problem:    Small bowel obstruction Active Problems:   Essential hypertension   SBO (small bowel obstruction)   Hypernatremia    Time spent: 25 minutes    [place] [facility] Pager [phone]. If 7PM-7AM, please contact night-coverage at [email or web address], password [email or web address] 10/20/2014, 3:43 PM  LOS: 4 days

## 2014-10-20 NOTE — Progress Notes (Signed)
  Subjective: Denies any nausea or abdominal pain. Did have a bowel movement yesterday.  Objective: Vital signs in last 24 hours: Temp:  [97.8 F (36.6 C)-99 F (37.2 C)] 97.8 F (36.6 C) (04/02 0649) Pulse Rate:  [61-85] 61 (04/02 0649) Resp:  [20] 20 (04/02 0649) BP: (150-163)/(65-76) 150/74 mmHg (04/02 0649) SpO2:  [96 %-97 %] 97 % (04/02 0649) Last BM Date: 10/17/14  Intake/Output from previous day: 04/01 0701 - 04/02 0700 In: 2400.8 [P.O.:360; I.V.:1990.8; IV Piggyback:50] Out: 1800 [Urine:1800] Intake/Output this shift:    General appearance: alert, cooperative and no distress GI: soft, non-tender; bowel sounds normal; no masses,  no organomegaly  Lab Results:   Recent Labs  10/19/14 0707 10/20/14 0601  WBC 6.5 6.9  HGB 11.8* 12.9  HCT 36.9 39.9  PLT 168 182   BMET  Recent Labs  10/19/14 0707 10/20/14 0601  NA 142 135  K 3.9 4.0  CL 109 104  CO2 23 24  GLUCOSE 69* 83  BUN 13 8  CREATININE 0.73 0.63  CALCIUM 7.8* 8.0*   PT/INR No results for input(s): LABPROT, INR in the last 72 hours.  Studies/Results: Dg Abd 1 View  10/18/2014   CLINICAL DATA:  NG tube placement 2 days ago. Pulled out and repositioned.  EXAM: ABDOMEN - 1 VIEW  COMPARISON:  10/16/2014  FINDINGS: NG tube tip is in the mid to distal stomach. Mild gaseous distention of small bowel loops compatible with small bowel obstruction. Overall slight this scratch head overall improvement in the small bowel distention since prior study.  IMPRESSION: NG tube tip in the mid to distal stomach. Continued partial small bowel obstruction, slightly improved.   Electronically Signed   By: Rolm Baptise M.D.   On: 10/18/2014 16:03    Anti-infectives: Anti-infectives    None      Assessment/Plan: Impression: Partial small bowel obstruction, resolving Plan: We'll advance to soft diet. Anticipate discharge in next 24-48 hours.  LOS: 4 days    Haley Roza A 10/20/2014

## 2014-10-21 LAB — CBC WITH DIFFERENTIAL/PLATELET
Basophils Absolute: 0 10*3/uL (ref 0.0–0.1)
Basophils Relative: 0 % (ref 0–1)
Eosinophils Absolute: 0.2 10*3/uL (ref 0.0–0.7)
Eosinophils Relative: 4 % (ref 0–5)
HCT: 39.8 % (ref 36.0–46.0)
Hemoglobin: 12.9 g/dL (ref 12.0–15.0)
LYMPHS ABS: 0.9 10*3/uL (ref 0.7–4.0)
LYMPHS PCT: 16 % (ref 12–46)
MCH: 29.7 pg (ref 26.0–34.0)
MCHC: 32.4 g/dL (ref 30.0–36.0)
MCV: 91.5 fL (ref 78.0–100.0)
MONO ABS: 0.7 10*3/uL (ref 0.1–1.0)
MONOS PCT: 11 % (ref 3–12)
Neutro Abs: 4.2 10*3/uL (ref 1.7–7.7)
Neutrophils Relative %: 69 % (ref 43–77)
Platelets: 167 10*3/uL (ref 150–400)
RBC: 4.35 MIL/uL (ref 3.87–5.11)
RDW: 13 % (ref 11.5–15.5)
WBC: 6.1 10*3/uL (ref 4.0–10.5)

## 2014-10-21 LAB — COMPREHENSIVE METABOLIC PANEL
ALT: 10 U/L (ref 0–35)
ANION GAP: 8 (ref 5–15)
AST: 19 U/L (ref 0–37)
Albumin: 2.9 g/dL — ABNORMAL LOW (ref 3.5–5.2)
Alkaline Phosphatase: 48 U/L (ref 39–117)
BUN: 7 mg/dL (ref 6–23)
CO2: 25 mmol/L (ref 19–32)
CREATININE: 0.69 mg/dL (ref 0.50–1.10)
Calcium: 8.2 mg/dL — ABNORMAL LOW (ref 8.4–10.5)
Chloride: 105 mmol/L (ref 96–112)
GFR calc Af Amer: 84 mL/min — ABNORMAL LOW (ref >90)
GFR, EST NON AFRICAN AMERICAN: 72 mL/min — AB (ref >90)
GLUCOSE: 112 mg/dL — AB (ref 70–99)
Potassium: 4.3 mmol/L (ref 3.5–5.1)
Sodium: 138 mmol/L (ref 135–145)
TOTAL PROTEIN: 5.5 g/dL — AB (ref 6.0–8.3)
Total Bilirubin: 0.6 mg/dL (ref 0.3–1.2)

## 2014-10-21 MED ORDER — HYDROCHLOROTHIAZIDE 25 MG PO TABS: ORAL_TABLET | ORAL | Status: AC

## 2014-10-21 MED ORDER — QUINAPRIL HCL 40 MG PO TABS: 40.0000 mg | ORAL_TABLET | Freq: Every day | ORAL | Status: AC

## 2014-10-21 MED ORDER — POLYETHYLENE GLYCOL 3350 17 G PO PACK: 17.0000 g | PACK | ORAL | Status: AC

## 2014-10-21 MED ORDER — ACETAMINOPHEN 325 MG PO TABS
650.0000 mg | ORAL_TABLET | ORAL | Status: DC | PRN
  Administered 2014-10-21: 650 mg via ORAL
  Filled 2014-10-21: qty 2

## 2014-10-21 MED ORDER — FAMOTIDINE 20 MG PO TABS: 20.0000 mg | ORAL_TABLET | Freq: Two times a day (BID) | ORAL | Status: DC

## 2014-10-21 NOTE — Discharge Summary (Signed)
Physician Discharge Summary  Katie Benitez OYD:[id number] DOB: 10-25-1918 DOA: 10/16/2014  PCP: Bronson Curb, PA-C  Admit date: 10/16/2014 Discharge date: 10/21/2014  Time spent: Greater than 30 minutes  Recommendations for Outpatient Follow-up:  1.   Discharge Diagnoses:  1. Recurrent or persistent small bowel obstruction. 2. Essential hypertension. 3. Hypernatremia. Resolved with medical management.  Discharge Condition: Improved and stable.  Diet recommendation: Heart healthy   Filed Weights   10/16/14 2000 10/17/14 0103  Weight: 64.864 kg (143 lb) 63.594 kg (140 lb 3.2 oz)    History of present illness:  The patient is a 81 year old woman with a history of essential hypertension. She had been just hospitalized from 10/13/2014 through 10/16/2014 for a small bowel obstruction. Her symptoms apparently improved and she was discharged home. However, she presented on 10/17/2014 with recurrent abdominal pain and abdominal distention. In the ED, she was afebrile and hemodynamically stable, but hypertensive. Her abdominal x-ray revealed progressive gaseous distention of the small bowel and stomach concerning for recurrent small bowel obstruction; there was contrast in the descending colon and rectum from the prior CT. Her white blood cell count was within normal limits. Her liver transaminases were within normal limits. She was readmitted for further evaluation and management.  Hospital Course:  The patient was made nothing by mouth. An NG tube was placed for decompression. IV fluids for hydration were started. IV Pepcid was started empirically. Her pain was treated with as needed analgesics and her nausea was treated with antiemetics. Her oral medications were withheld. Her hypertension was treated with transdermal Catapres, IV metoprolol, and when necessary IV hydralazine. General surgeon, Dr. Arnoldo Morale was consulted. He agreed with medical management. Per his assessment, he wanted to try  to avoid operative repair in this elderly patient with comorbid conditions. The NG tube did come out inadvertently on several occasions, but it was reinserted and readjusted. After several days, she had several liters of bilious output. Following several days of decompression, there was no clinical evidence of persistent small bowel obstruction.  Dr. Arnoldo Morale ordered an enema which resulted in her having a small to moderate bowel movement. The NG tube was removed and she was started on clear liquids. She tolerated clear liquids  well. MiraLAX was ordered. Following MiraLAX, she had several more bowel movements. Her diet was subsequent advanced to a soft diet which she tolerated well.  The patient serum sodium increased slightly to 146. Her IV fluids were changed to half-normal saline. The mild hypernatremia resolved. Most of her antihypertensive medications were resumed by mouth. Because of her lower than normal by mouth intake, she was instructed to take half of her usual dose of quinapril and hydrochlorothiazide for 3 days following discharge. She was instructed to continue her other antihypertensive medications as previously prescribed without change. This was discussed with her daughters who voiced understanding.  She was discharged to home in improved and stable condition.  Procedures:  NG tube insertion and discontinuation.  Consultations:  Gen. surgeon, Dr. Arnoldo Morale.  Discharge Exam: Filed Vitals:   10/20/14 2154  BP: 149/75  Pulse: 62  Temp: 97.5 F (36.4 C)  Resp: 20     General: Pleasant alert 70 year African-American woman in no acute distress.  Cardiovascular: S1, S2, with a soft systolic murmur.  Respiratory: Clear to auscultation bilaterally.  Abdomen: Soft, positive bowel sounds, nontender, nondistended.  Musculoskeletal: no clubbing or cyanosis  Discharge Instructions   Discharge Instructions    Diet - low sodium heart healthy  Complete by:  As directed       Increase activity slowly    Complete by:  As directed           Current Discharge Medication List    START taking these medications   Details  polyethylene glycol (MIRALAX / GLYCOLAX) packet Take 17 g by mouth every other day.    quinapril (ACCUPRIL) 40 MG tablet Take 1 tablet (40 mg total) by mouth daily. Starting tomorrow, take 1/2 tablet daily for 3 days, then restart 1 tablet daily thereafter.      CONTINUE these medications which have CHANGED   Details  hydrochlorothiazide (HYDRODIURIL) 25 MG tablet Starting tomorrow, take 1/2 tablet daily for 3 days; then restart 1 tablet daily thereafter.      CONTINUE these medications which have NOT CHANGED   Details  acetaminophen (TYLENOL) 500 MG tablet Take 500 mg by mouth every 6 (six) hours as needed for mild pain or moderate pain.    amLODipine (NORVASC) 5 MG tablet Take 5 mg by mouth daily.    atenolol (TENORMIN) 100 MG tablet Take 100 mg by mouth 2 (two) times daily.     Calcium Carb-Cholecalciferol 600-200 MG-UNIT TABS Take 1 tablet by mouth 2 (two) times daily.    cloNIDine (CATAPRES) 0.1 MG tablet Take 0.1 mg by mouth 2 (two) times daily.    tamsulosin (FLOMAX) 0.4 MG CAPS capsule Take 0.4 mg by mouth daily.      STOP taking these medications     docusate sodium (COLACE) 100 MG capsule      enalapril (VASOTEC) 20 MG tablet        No Known Allergies Follow-up Information    Follow up with [facility].   Contact information:   [place] [address] [phone]       Follow up with ROBERTSON, ANTHONY T, PA-C.   Specialty:  Physician Assistant   Why:  In 1-2 weeks.   Contact information:   [address] [place] [address] [phone]        The results of significant diagnostics from this hospitalization (including imaging, microbiology, ancillary and laboratory) are listed below for reference.    Significant Diagnostic Studies: Dg Abd 1  View  10/18/2014   CLINICAL DATA:  NG tube placement 2 days ago. Pulled out and repositioned.  EXAM: ABDOMEN - 1 VIEW  COMPARISON:  10/16/2014  FINDINGS: NG tube tip is in the mid to distal stomach. Mild gaseous distention of small bowel loops compatible with small bowel obstruction. Overall slight this scratch head overall improvement in the small bowel distention since prior study.  IMPRESSION: NG tube tip in the mid to distal stomach. Continued partial small bowel obstruction, slightly improved.   Electronically Signed   By: Rolm Baptise M.D.   On: 10/18/2014 16:03   Dg Abd 1 View  10/14/2014   CLINICAL DATA:  Small bowel obstruction with nausea and weakness  EXAM: ABDOMEN - 1 VIEW  COMPARISON:  CT abdomen and pelvis October 13, 2014  FINDINGS: Nasogastric tube tip and side port are in the stomach. There remain loops of mildly dilated small bowel in the mid abdomen. Contrast is seen in the colon. No free air is seen on this supine examination. There is contrast within the urinary bladder. There is extensive degenerative type change throughout the lumbar spine.  IMPRESSION: Nasogastric tube tip and side port in stomach. Loops of dilated small bowel are  noted. Contrast does reach colon. A degree of incomplete small bowel obstruction may well remain.   Electronically Signed   By: Lowella Grip III M.D.   On: 10/14/2014 09:22   Ct Abdomen Pelvis W Contrast  10/13/2014   CLINICAL DATA:  Nausea with onset last night. Abdominal pain, unspecified abdominal location.  EXAM: CT ABDOMEN AND PELVIS WITH CONTRAST  TECHNIQUE: Multidetector CT imaging of the abdomen and pelvis was performed using the standard protocol following bolus administration of intravenous contrast.  CONTRAST:  33mL OMNIPAQUE IOHEXOL 300 MG/ML SOLN, 162mL OMNIPAQUE IOHEXOL 300 MG/ML SOLN  COMPARISON:  Abdominal series [phone]  FINDINGS: Prominent lung markings at the lung bases suggestive for atelectasis or scarring. Negative for pleural  effusions. No evidence for free intraperitoneal air.  Low-attenuation fluid in the subcarinal region could be related to the pericardial recess. Atherosclerotic calcifications in the coronary arteries. No significant pericardial effusion.  There is a trace amount of perihepatic fluid near the anterior dome. Gallbladder is mildly distended. Portal venous system is patent. Normal appearance of the spleen and pancreas. There is trace fluid along the left hemidiaphragm and adjacent to the spleen. Indeterminate 1.6 cm right adrenal nodule. Normal appearance the left adrenal gland. Mildly dense round structure along the right kidney upper pole (measures 1.5 cm) is indeterminate based on the Hounsfield units (31). There are additional small hypodense structures throughout the right kidney that probably represent cysts. Probable small cysts in the left kidney. No evidence for hydronephrosis.  Uterus appears to be surgically absent. Moderate amount of fluid in the urinary bladder. Trace free fluid in the pelvis. Small amount of free fluid in the left paracolic gutter. There is free fluid and mesenteric edema in the right lower abdomen. Dilated loops of small bowel in the mid and right abdomen. The terminal ileum and appendix appear to be normal. Dilated small bowel loops measuring up to 2.7 cm.  No significant lymphadenopathy. Diffuse atherosclerotic calcifications involving the arterial structures. The abdominal aorta at the hiatus measures 3.1 cm. Large amount of mural thrombus along the right side of the infrarenal abdominal aorta. The infrarenal abdominal aorta is ectatic measuring up to 2.8 cm.  Severe multilevel degenerative disease in the lumbar spine. Anterolisthesis at L4-L5.  IMPRESSION: Dilated loops of small bowel with abdominal ascites. The distal small bowel is decompressed. Findings are suggestive for a small bowel obstruction. A transition point is not clearly identified.  Several low density structures in  the kidneys are suggestive for cysts. Largest in the right kidney upper pole is indeterminate based on the Hounsfield units.  1.6 cm right adrenal nodule is indeterminate. Favor a benign etiology based on the size.  Diffuse atherosclerotic disease involving the aorta and coronary arteries.  These results were called by telephone at the time of interpretation on 10/13/2014 at 11:54 am to Dr. Leonard Schwartz , who verbally acknowledged these results.   Electronically Signed   By: Markus Daft M.D.   On: 10/13/2014 11:55   Dg Chest Port 1 View  10/14/2014   CLINICAL DATA:  Nasogastric tube replacement  EXAM: PORTABLE CHEST - 1 VIEW  COMPARISON:  Study obtained earlier in the day  FINDINGS: Nasogastric tube tip and side port are in the stomach. There is patchy airspace consolidation in the right base. There is minimal atelectasis in the left base. Lungs elsewhere clear. Heart is prominent but stable. Pulmonary vascularity is normal. No adenopathy.  IMPRESSION: Nasogastric tube tip and side port in stomach. Patchy infiltrate  right base. Slight atelectasis left base. Heart prominent but stable.   Electronically Signed   By: Lowella Grip III M.D.   On: 10/14/2014 15:59   Dg Chest Port 1 View  10/14/2014   CLINICAL DATA:  Nausea and weakness.  Small bowel obstruction.  EXAM: PORTABLE CHEST - 1 VIEW  COMPARISON:  October 13, 2014  FINDINGS: Nasogastric tube tip and side port are in the stomach. There is new airspace consolidation in the right lung base. There is mild atelectasis in the left base. Lungs elsewhere clear. Heart is slightly prominent with pulmonary vascularity within normal limits. No adenopathy.  IMPRESSION: New patchy infiltrate right base. Mild left base atelectasis. No change in cardiac silhouette. Nasogastric tube tip and side port in stomach.   Electronically Signed   By: Lowella Grip III M.D.   On: 10/14/2014 07:39   Dg Abd 2 Views  10/16/2014   CLINICAL DATA:  81 year old female with  abdominal pain. Recent hospitalization and discharge for small bowel obstruction.  EXAM: ABDOMEN - 2 VIEW  COMPARISON:  Abdominal radiograph 10/14/2014, CT 10/13/2014  FINDINGS: Increase gaseous distention of small bowel loops in the central abdomen, small bowel dilatation up to 5 cm. There is oral contrast in the descending colon and rectum from prior CT. Mild gaseous gastric distension with air-fluid level. No free intra-abdominal air.  IMPRESSION: Progressive gaseous distention of small bowel and stomach, concerning for recurrent small bowel obstruction. There is oral contrast in the descending colon and rectum from prior CT.   Electronically Signed   By: Jeb Levering M.D.   On: 10/16/2014 22:54   Dg Abd Acute W/chest  10/13/2014   CLINICAL DATA:  Nausea and vomiting  EXAM: ACUTE ABDOMEN SERIES (ABDOMEN 2 VIEW & CHEST 1 VIEW)  COMPARISON:  None  FINDINGS: PA chest: No edema or consolidation. Heart is upper normal in size with pulmonary vascularity within normal limits. No adenopathy. There is degenerative change in the thoracic spine.  Supine and upright abdomen: There is moderate stool in the colon. There is no bowel dilatation or air-fluid level suggesting obstruction. No free air. There are surgical clips in the you lower abdomen and pelvis. There is degenerative change in the lumbar spine.  IMPRESSION: Overall bowel gas pattern unremarkable. No demonstrable obstruction or free air. No edema or consolidation in the lungs.   Electronically Signed   By: Lowella Grip III M.D.   On: 10/13/2014 08:38    Microbiology: No results found for this or any previous visit (from the past 240 hour(s)).   Labs: Basic Metabolic Panel:  Recent Labs Lab 10/16/14 2100 10/17/14 2979 10/18/14 0636 10/19/14 0707 10/20/14 0601 10/21/14 0552  NA  --  144 146* 142 135 138  K  --  3.7 3.9 3.9 4.0 4.3  CL  --  109 111 109 104 105  CO2  --  27 27 23 24 25   GLUCOSE  --  106* 85 69* 83 112*  BUN  --  12 13  13 8 7   CREATININE  --  0.68 0.75 0.73 0.63 0.69  CALCIUM  --  8.1* 8.0* 7.8* 8.0* 8.2*  MG 1.7  --   --   --   --   --    Liver Function Tests:  Recent Labs Lab 10/17/14 0651 10/18/14 0636 10/19/14 0707 10/20/14 0601 10/21/14 0552  AST 16 15 16 17 19   ALT 9 8 8 8 10   ALKPHOS 45 39 40 44 48  BILITOT 0.7 1.1 1.3* 0.9 0.6  PROT 5.7* 5.4* 5.4* 5.6* 5.5*  ALBUMIN 2.9* 2.9* 2.9* 3.0* 2.9*   No results for input(s): LIPASE, AMYLASE in the last 168 hours. No results for input(s): AMMONIA in the last 168 hours. CBC:  Recent Labs Lab 10/17/14 0651 10/18/14 0636 10/19/14 0707 10/20/14 0601 10/21/14 0552  WBC 7.5 5.9 6.5 6.9 6.1  NEUTROABS 5.5 4.3 4.5 4.8 4.2  HGB 12.5 12.0 11.8* 12.9 12.9  HCT 38.9 37.2 36.9 39.9 39.8  MCV 92.6 93.2 93.2 91.7 91.5  PLT 164 166 168 182 167   Cardiac Enzymes: No results for input(s): CKTOTAL, CKMB, CKMBINDEX, TROPONINI in the last 168 hours. BNP: BNP (last 3 results) No results for input(s): BNP in the last 8760 hours.  ProBNP (last 3 results) No results for input(s): PROBNP in the last 8760 hours.  CBG: No results for input(s): GLUCAP in the last 168 hours.     Signed:  Lailyn Appelbaum  [facility] 10/21/2014, 1:11 PM

## 2014-10-21 NOTE — Progress Notes (Signed)
The patient is receiving Pepcid by the intravenous route.  Based on criteria approved by the Pharmacy and [place], the medication is being converted to the equivalent oral dose form.  These criteria include: -No Active GI bleeding -Able to tolerate diet of full liquids (or better) or tube feeding OR able to tolerate other medications by the oral or enteral route  If you have any questions about this conversion, please contact the Pharmacy Department (ext [phone]).  Thank you.  Biagio Borg, [facility] 10/21/2014 10:48 AM

## 2014-10-21 NOTE — Progress Notes (Signed)
  Subjective: Tolerating soft diet well. Continues to have bowel movements.  Objective: Vital signs in last 24 hours: Temp:  [97.5 F (36.4 C)-98.1 F (36.7 C)] 97.5 F (36.4 C) (04/02 2154) Pulse Rate:  [62-66] 62 (04/02 2154) Resp:  [20] 20 (04/02 2154) BP: (149-153)/(63-75) 149/75 mmHg (04/02 2154) SpO2:  [95 %-96 %] 95 % (04/02 2154) Last BM Date: 10/20/14  Intake/Output from previous day: 04/02 0701 - 04/03 0700 In: 1243.3 [P.O.:680; I.V.:563.3] Out: 550 [Urine:550] Intake/Output this shift: Total I/O In: -  Out: 350 [Urine:350]  General appearance: alert, cooperative and no distress GI: soft, non-tender; bowel sounds normal; no masses,  no organomegaly  Lab Results:   Recent Labs  10/20/14 0601 10/21/14 0552  WBC 6.9 6.1  HGB 12.9 12.9  HCT 39.9 39.8  PLT 182 167   BMET  Recent Labs  10/20/14 0601 10/21/14 0552  NA 135 138  K 4.0 4.3  CL 104 105  CO2 24 25  GLUCOSE 83 112*  BUN 8 7  CREATININE 0.63 0.69  CALCIUM 8.0* 8.2*   PT/INR No results for input(s): LABPROT, INR in the last 72 hours.  Studies/Results: No results found.  Anti-infectives: Anti-infectives    None      Assessment/Plan: Impression: Small bowel obstruction, resolved Plan: Okay for discharge from surgery standpoint.  LOS: 5 days    Rihaan Barrack A 10/21/2014

## 2014-10-21 NOTE — Progress Notes (Signed)
Discharge instructions and prescriptions given, verbalized understanding, out in stable condition via w/c with staff. 

## 2015-02-26 ENCOUNTER — Emergency Department (HOSPITAL_COMMUNITY)
Admission: EM | Admit: 2015-02-26 | Discharge: 2015-02-26 | Disposition: A | Discharge status: Home / Self Care | Payer: Medicare Other | Attending: Emergency Medicine | Admitting: Emergency Medicine

## 2015-02-26 ENCOUNTER — Emergency Department (HOSPITAL_COMMUNITY): Payer: Medicare Other

## 2015-02-26 ENCOUNTER — Encounter (HOSPITAL_COMMUNITY): Payer: Self-pay | Admitting: Emergency Medicine

## 2015-02-26 DIAGNOSIS — Z8669 Personal history of other diseases of the nervous system and sense organs: Secondary | ICD-10-CM | POA: Diagnosis not present

## 2015-02-26 DIAGNOSIS — Z8673 Personal history of transient ischemic attack (TIA), and cerebral infarction without residual deficits: Secondary | ICD-10-CM | POA: Insufficient documentation

## 2015-02-26 DIAGNOSIS — Z79899 Other long term (current) drug therapy: Secondary | ICD-10-CM | POA: Diagnosis not present

## 2015-02-26 DIAGNOSIS — Z8542 Personal history of malignant neoplasm of other parts of uterus: Secondary | ICD-10-CM | POA: Insufficient documentation

## 2015-02-26 DIAGNOSIS — N390 Urinary tract infection, site not specified: Secondary | ICD-10-CM

## 2015-02-26 DIAGNOSIS — Z87891 Personal history of nicotine dependence: Secondary | ICD-10-CM | POA: Diagnosis not present

## 2015-02-26 DIAGNOSIS — I10 Essential (primary) hypertension: Secondary | ICD-10-CM | POA: Diagnosis not present

## 2015-02-26 DIAGNOSIS — Z8719 Personal history of other diseases of the digestive system: Secondary | ICD-10-CM | POA: Diagnosis not present

## 2015-02-26 DIAGNOSIS — R1084 Generalized abdominal pain: Secondary | ICD-10-CM | POA: Diagnosis present

## 2015-02-26 DIAGNOSIS — R109 Unspecified abdominal pain: Secondary | ICD-10-CM

## 2015-02-26 LAB — COMPREHENSIVE METABOLIC PANEL
ALBUMIN: 3.5 g/dL (ref 3.5–5.0)
ALT: 17 U/L (ref 14–54)
ANION GAP: 8 (ref 5–15)
AST: 23 U/L (ref 15–41)
Alkaline Phosphatase: 58 U/L (ref 38–126)
BILIRUBIN TOTAL: 0.9 mg/dL (ref 0.3–1.2)
BUN: 19 mg/dL (ref 6–20)
CO2: 31 mmol/L (ref 22–32)
Calcium: 9.1 mg/dL (ref 8.9–10.3)
Chloride: 101 mmol/L (ref 101–111)
Creatinine, Ser: 0.91 mg/dL (ref 0.44–1.00)
GFR calc Af Amer: >60 mL/min (ref >60)
GFR calc non Af Amer: 52 mL/min — ABNORMAL LOW (ref >60)
GLUCOSE: 133 mg/dL — AB (ref 65–99)
Potassium: 3.7 mmol/L (ref 3.5–5.1)
SODIUM: 140 mmol/L (ref 135–145)
TOTAL PROTEIN: 6.2 g/dL — AB (ref 6.5–8.1)

## 2015-02-26 LAB — CBC WITH DIFFERENTIAL/PLATELET
BASOS ABS: 0 10*3/uL (ref 0.0–0.1)
Basophils Relative: 0 % (ref 0–1)
EOS ABS: 0 10*3/uL (ref 0.0–0.7)
Eosinophils Relative: 0 % (ref 0–5)
HCT: 37.8 % (ref 36.0–46.0)
Hemoglobin: 12.4 g/dL (ref 12.0–15.0)
Lymphocytes Relative: 3 % — ABNORMAL LOW (ref 12–46)
Lymphs Abs: 0.3 10*3/uL — ABNORMAL LOW (ref 0.7–4.0)
MCH: 30.3 pg (ref 26.0–34.0)
MCHC: 32.8 g/dL (ref 30.0–36.0)
MCV: 92.4 fL (ref 78.0–100.0)
Monocytes Absolute: 0.2 10*3/uL (ref 0.1–1.0)
Monocytes Relative: 2 % — ABNORMAL LOW (ref 3–12)
Neutro Abs: 7.2 10*3/uL (ref 1.7–7.7)
Neutrophils Relative %: 93 % — ABNORMAL HIGH (ref 43–77)
Platelets: ADEQUATE 10*3/uL (ref 150–400)
RBC: 4.09 MIL/uL (ref 3.87–5.11)
RDW: 12.7 % (ref 11.5–15.5)
Smear Review: ADEQUATE
WBC: 7.7 10*3/uL (ref 4.0–10.5)

## 2015-02-26 LAB — URINALYSIS, ROUTINE W REFLEX MICROSCOPIC
Bilirubin Urine: NEGATIVE
GLUCOSE, UA: NEGATIVE mg/dL
Ketones, ur: NEGATIVE mg/dL
Nitrite: NEGATIVE
PH: 7 (ref 5.0–8.0)
Protein, ur: NEGATIVE mg/dL
SPECIFIC GRAVITY, URINE: 1.01 (ref 1.005–1.030)
UROBILINOGEN UA: 0.2 mg/dL (ref 0.0–1.0)

## 2015-02-26 LAB — URINE MICROSCOPIC-ADD ON

## 2015-02-26 LAB — LIPASE, BLOOD: Lipase: 25 U/L (ref 22–51)

## 2015-02-26 MED ORDER — ACETAMINOPHEN 500 MG PO TABS
1000.0000 mg | ORAL_TABLET | Freq: Once | ORAL | Status: AC
  Administered 2015-02-26: 1000 mg via ORAL
  Filled 2015-02-26: qty 2

## 2015-02-26 MED ORDER — SODIUM CHLORIDE 0.9 % IV BOLUS (SEPSIS)
500.0000 mL | Freq: Once | INTRAVENOUS | Status: AC
  Administered 2015-02-26: 500 mL via INTRAVENOUS

## 2015-02-26 MED ORDER — ONDANSETRON 4 MG PREPACK (~~LOC~~)
1.0000 | ORAL_TABLET | Freq: Three times a day (TID) | ORAL | Status: AC | PRN
Start: 2015-02-26 — End: ?

## 2015-02-26 MED ORDER — ONDANSETRON HCL 4 MG/2ML IJ SOLN
4.0000 mg | Freq: Once | INTRAMUSCULAR | Status: AC
  Administered 2015-02-26: 4 mg via INTRAVENOUS
  Filled 2015-02-26: qty 2

## 2015-02-26 MED ORDER — CEPHALEXIN 500 MG PO CAPS
500.0000 mg | ORAL_CAPSULE | Freq: Once | ORAL | Status: AC
  Administered 2015-02-26: 500 mg via ORAL
  Filled 2015-02-26: qty 1

## 2015-02-26 MED ORDER — CEPHALEXIN 500 MG PO CAPS: 500.0000 mg | ORAL_CAPSULE | Freq: Three times a day (TID) | ORAL | Status: AC

## 2015-02-26 NOTE — ED Provider Notes (Signed)
CSN: [id number]     Arrival date & time 02/26/15  0619 History   First MD Initiated Contact with Patient 02/26/15 0703     Chief Complaint  Patient presents with  . Abdominal Pain     (Consider location/radiation/quality/duration/timing/severity/associated sxs/prior Treatment) HPI..... Patient presents with minimal diffuse abdominal pain for approximately 24 hours with associated nausea, chills, diarrhea 2 episodes. She has been able to drink a small amount of fluid. Status post abdominal hysterectomy years ago. Severity of pain is moderate. No radiation.  Past Medical History  Diagnosis Date  . Diverticulitis   . Hypertension   . TIA (transient ischemic attack)   . Glaucoma   . Cancer     uterine  . SBO (small bowel obstruction)    Past Surgical History  Procedure Laterality Date  . Abdominal hysterectomy    . Glaucoma surgery     Family History  Problem Relation Age of Onset  . Hypertension Father   . Hypertension Sister   . Coronary artery disease Other    History  Substance Use Topics  . Smoking status: Former Research scientist (life sciences)  . Smokeless tobacco: Never Used  . Alcohol Use: No   OB History    Gravida Para Term Preterm AB TAB SAB Ectopic Multiple Living   6 6 6             Review of Systems  All other systems reviewed and are negative.     Allergies  Review of patient's allergies indicates no known allergies.  Home Medications   Prior to Admission medications   Medication Sig Start Date End Date Taking? Authorizing Provider  acetaminophen (TYLENOL) 500 MG tablet Take 500 mg by mouth every 6 (six) hours as needed for mild pain or moderate pain.   Yes Historical Provider, MD  amLODipine (NORVASC) 5 MG tablet Take 5 mg by mouth daily.   Yes Historical Provider, MD  atenolol (TENORMIN) 100 MG tablet Take 100 mg by mouth 2 (two) times daily.    Yes Historical Provider, MD  Calcium Carb-Cholecalciferol 600-200 MG-UNIT TABS Take 1 tablet by mouth 2 (two) times daily.    Yes Historical Provider, MD  cloNIDine (CATAPRES) 0.1 MG tablet Take 0.1 mg by mouth 2 (two) times daily.   Yes Historical Provider, MD  docusate sodium (COLACE) 100 MG capsule Take 200 mg by mouth at bedtime.   Yes Historical Provider, MD  hydrochlorothiazide (HYDRODIURIL) 25 MG tablet Starting tomorrow, take 1/2 tablet daily for 3 days; then restart 1 tablet daily thereafter. 10/21/14  Yes Rexene Alberts, MD  polyethylene glycol (MIRALAX / GLYCOLAX) packet Take 17 g by mouth every other day. 10/21/14  Yes Rexene Alberts, MD  quinapril (ACCUPRIL) 40 MG tablet Take 1 tablet (40 mg total) by mouth daily. Starting tomorrow, take 1/2 tablet daily for 3 days, then restart 1 tablet daily thereafter. 10/21/14  Yes Rexene Alberts, MD  tamsulosin (FLOMAX) 0.4 MG CAPS capsule Take 0.4 mg by mouth daily.   Yes Historical Provider, MD  cephALEXin (KEFLEX) 500 MG capsule Take 1 capsule (500 mg total) by mouth 3 (three) times daily. 02/26/15   Nat Christen, MD  ondansetron (ZOFRAN) 4 mg TABS tablet Take 4 tablets by mouth every 8 (eight) hours as needed. 02/26/15   Nat Christen, MD   BP 134/66 mmHg  Pulse 65  Temp(Src) 98.8 F (37.1 C) (Oral)  Resp 18  Ht 5' (1.524 m)  Wt 130 lb (58.968 kg)  BMI 25.39 kg/m2  SpO2 98% Physical  Exam  Constitutional: She is oriented to person, place, and time. She appears well-developed and well-nourished.  HENT:  Head: Normocephalic and atraumatic.  Eyes: Conjunctivae and EOM are normal. Pupils are equal, round, and reactive to light.  Neck: Normal range of motion. Neck supple.  Cardiovascular: Normal rate and regular rhythm.   Pulmonary/Chest: Effort normal and breath sounds normal.  Abdominal: Soft. Bowel sounds are normal.  Minimal diffuse tenderness  Musculoskeletal: Normal range of motion.  Neurological: She is alert and oriented to person, place, and time.  Skin: Skin is warm and dry.  Psychiatric: She has a normal mood and affect. Her behavior is normal.  Nursing note and  vitals reviewed.   ED Course  Procedures (including critical care time) Labs Review Labs Reviewed  COMPREHENSIVE METABOLIC PANEL - Abnormal; Notable for the following:    Glucose, Bld 133 (*)    Total Protein 6.2 (*)    GFR calc non Af Amer 52 (*)    All other components within normal limits  CBC WITH DIFFERENTIAL/PLATELET - Abnormal; Notable for the following:    Neutrophils Relative % 93 (*)    Lymphocytes Relative 3 (*)    Lymphs Abs 0.3 (*)    Monocytes Relative 2 (*)    All other components within normal limits  URINALYSIS, ROUTINE W REFLEX MICROSCOPIC (NOT AT [facility]) - Abnormal; Notable for the following:    APPearance CLOUDY (*)    Hgb urine dipstick TRACE (*)    Leukocytes, UA SMALL (*)    All other components within normal limits  URINE MICROSCOPIC-ADD ON - Abnormal; Notable for the following:    Squamous Epithelial / LPF MANY (*)    Bacteria, UA MANY (*)    All other components within normal limits  URINE CULTURE  LIPASE, BLOOD    Imaging Review Dg Abd Acute W/chest  02/26/2015   CLINICAL DATA:  Abdominal pain.  Chills.  EXAM: DG ABDOMEN ACUTE W/ 1V CHEST  COMPARISON:  10/18/2014  FINDINGS: There is no evidence of dilated bowel loops or free intraperitoneal air. No radiopaque calculi or other significant radiographic abnormality is seen. Heart size and mediastinal contours are within normal limits. Both lungs are clear.  Diffuse lumbar spine spondylosis.  IMPRESSION: Negative abdominal radiographs.  No acute cardiopulmonary disease.   Electronically Signed   By: Kathreen Devoid   On: 02/26/2015 08:11     EKG Interpretation None      MDM   Final diagnoses:  Abdominal pain, unspecified abdominal location  UTI (lower urinary tract infection)    Patient feels much better after IV fluids. No acute abdomen. Urinalysis shows evidence of a minor infection. Will start Keflex 500 mg 3 times a day. Urine culture. Discharge medications Zofran 4 mg and Keflex 500 mg. She will  return if worse. Discussed with patient and her 3 children.    Nat Christen, MD 02/26/15 1126

## 2015-02-26 NOTE — Discharge Instructions (Signed)
You have a slight urinary tract infection. Prescription for antibiotic and medication for nausea. Increase fluids. Follow-up your primary care doctor or return if worse.

## 2015-02-26 NOTE — ED Notes (Signed)
Pt c/o abd pain with 2 liquid stools this am. Pt also c/o chills.

## 2015-02-28 LAB — URINE CULTURE

## 2015-03-01 ENCOUNTER — Telehealth (HOSPITAL_COMMUNITY): Payer: Self-pay

## 2015-03-01 NOTE — Telephone Encounter (Signed)
Post ED Visit - Positive Culture Follow-up  Culture report reviewed by antimicrobial stewardship pharmacist: []  Wes Dulaney, Pharm.D., BCPS []  Heide Guile, Pharm.D., BCPS []  Alycia Rossetti, Pharm.D., BCPS []  DeBary, [place].D., BCPS, AAHIVP []  Legrand Como, Pharm.D., BCPS, AAHIVP []  Isac Sarna, Pharm.D., BCPS X  Stewart,Cassie, Pharm. D.  Positive Urine culture, >/= 100,000 colonies E Coli Treated with Cephalexin, organism sensitive to the same and no further patient follow-up is required at this time.  Dortha Kern 03/01/2015, 11:44 AM

## 2016-11-26 IMAGING — CT CT ABD-PELV W/ CM
2 of 5 series · 15 of 46 positions shown, 17 images · IV contrast (Omnipaque 300)
Comparison: Abdominal series 14393

CLINICAL DATA: Nausea with onset last night. Abdominal pain,
unspecified abdominal location.

EXAM:
CT ABDOMEN AND PELVIS WITH CONTRAST
TECHNIQUE: Multidetector CT imaging of the abdomen and pelvis was performed
using the standard protocol following bolus administration of
intravenous contrast.
CONTRAST:  25mL OMNIPAQUE IOHEXOL 300 MG/ML SOLN, 100mL OMNIPAQUE
IOHEXOL 300 MG/ML SOLN

[Series 2: abd_pel_with 5.0 b40f · axial · 0.68mm/px · z∈[-457,-67]mm · 12 of 88 slices shown, 14 images]
[im 5/88  soft-tissue]
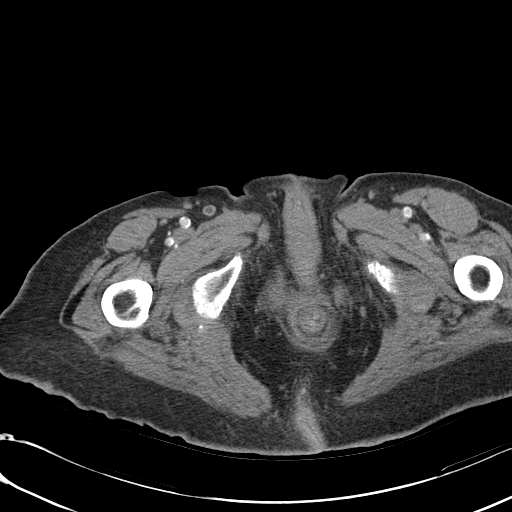
[im 5/88  bone]
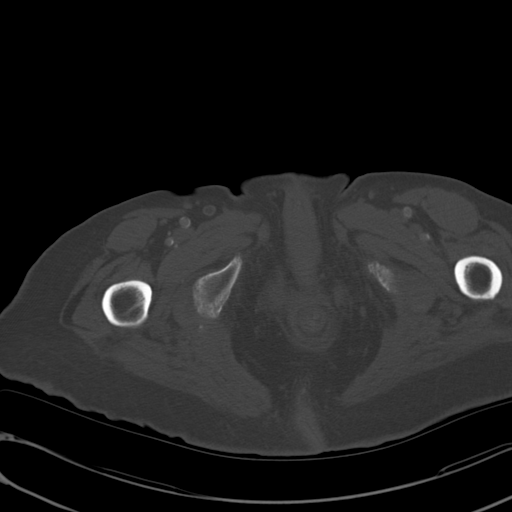
[im 15/88  soft-tissue]
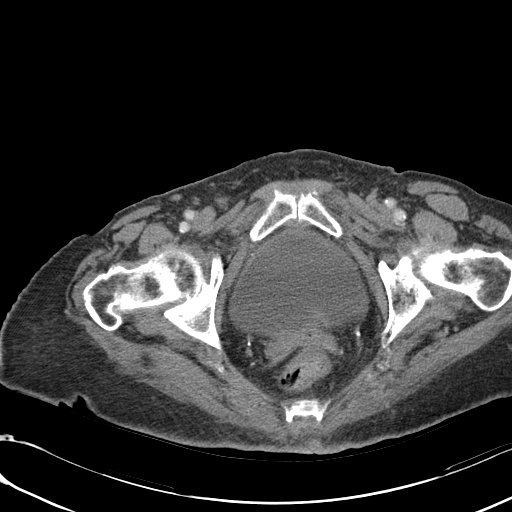
[im 20/88  soft-tissue]
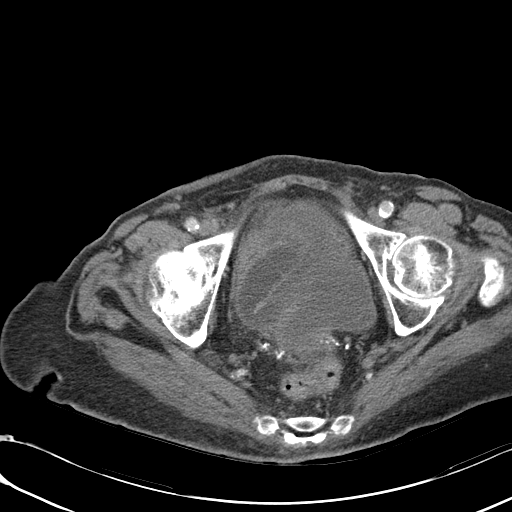
[im 25/88  soft-tissue]
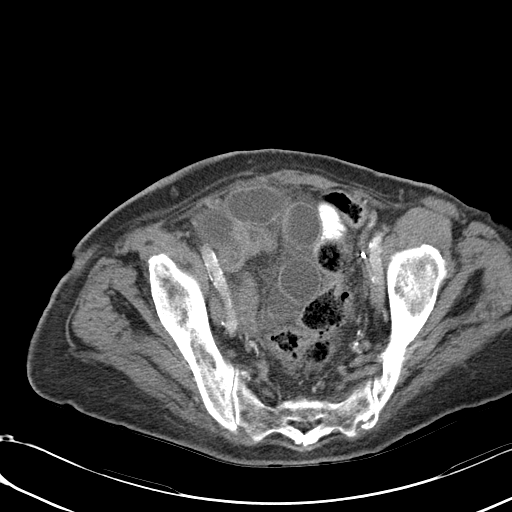
[im 34/88  soft-tissue]
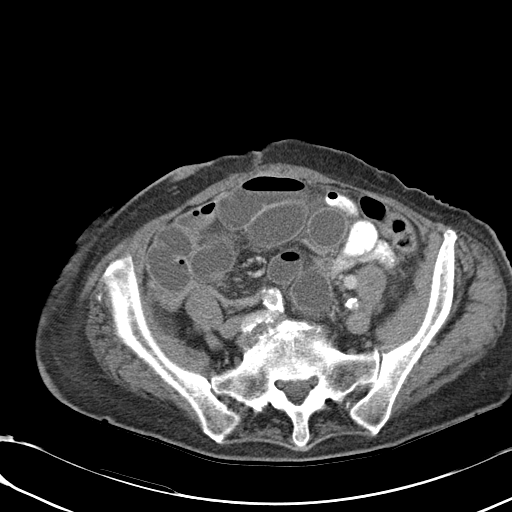
[im 39/88  soft-tissue]
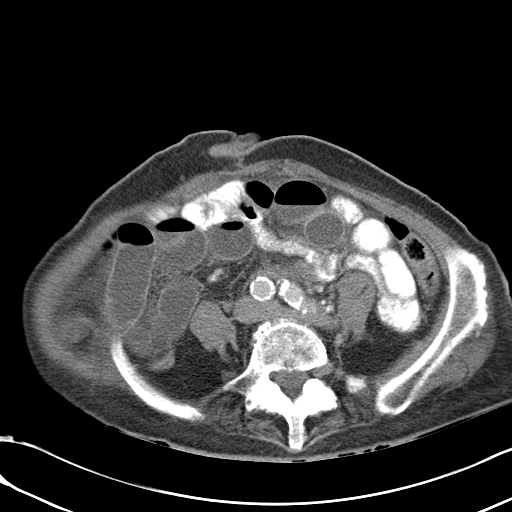
[im 49/88  soft-tissue]
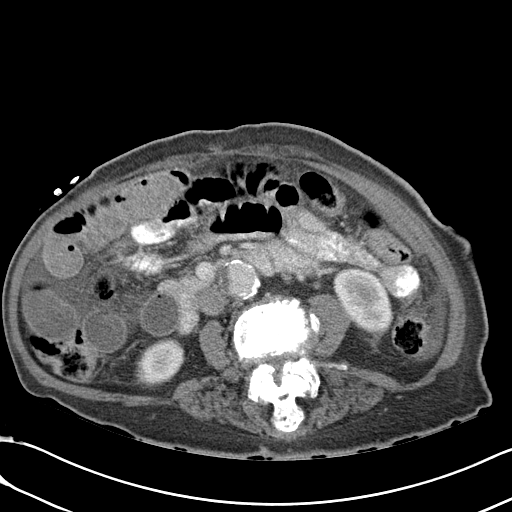
[im 54/88  soft-tissue]
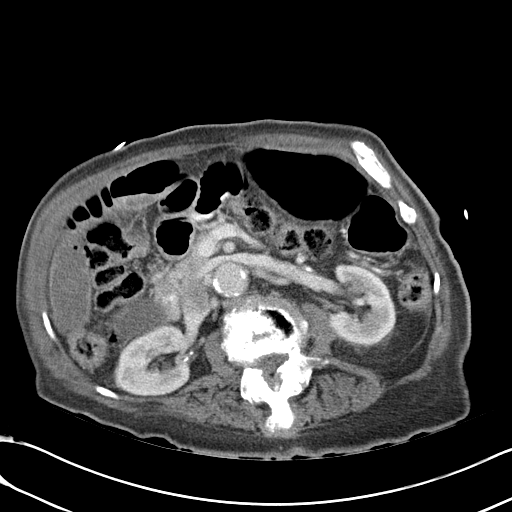
[im 63/88  soft-tissue]
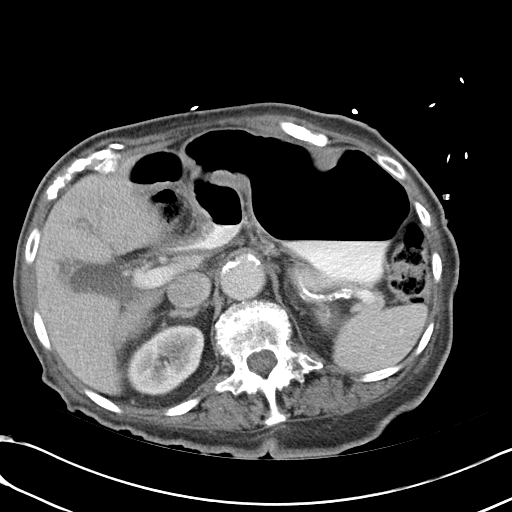
[im 63/88  bone]
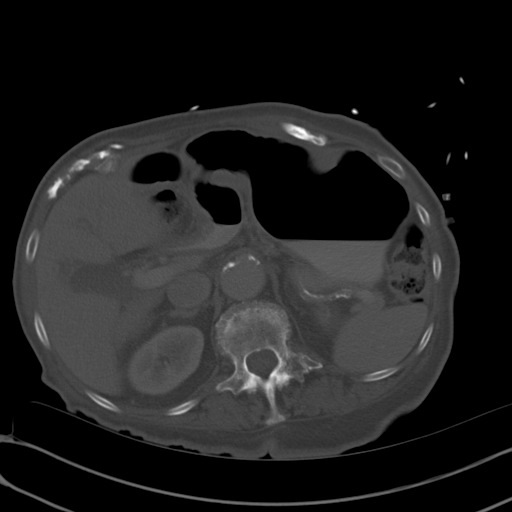
[im 68/88  soft-tissue]
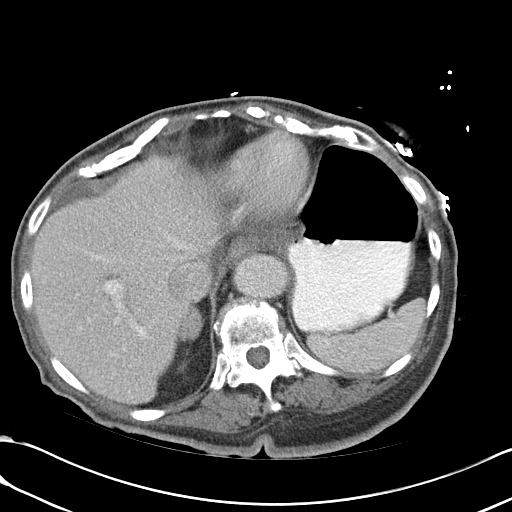
[im 73/88  soft-tissue]
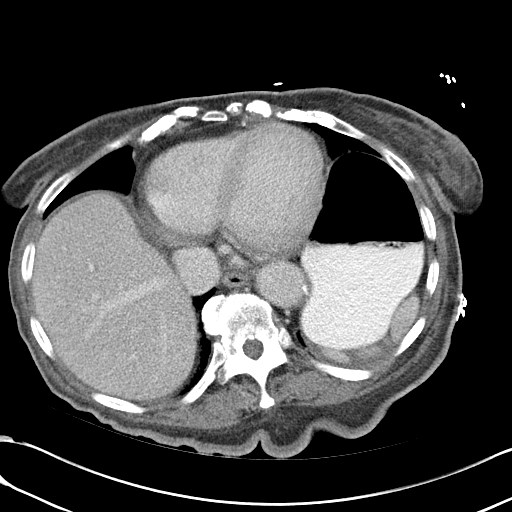
[im 83/88  soft-tissue]
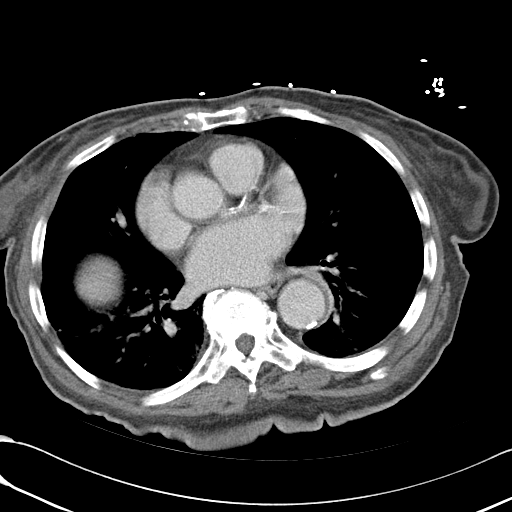

[Series 3: abd_pel_with 3.0 spo cor · coronal · 0.72mm/px · 3 of 89 slices shown]
[im 30/89  soft-tissue]
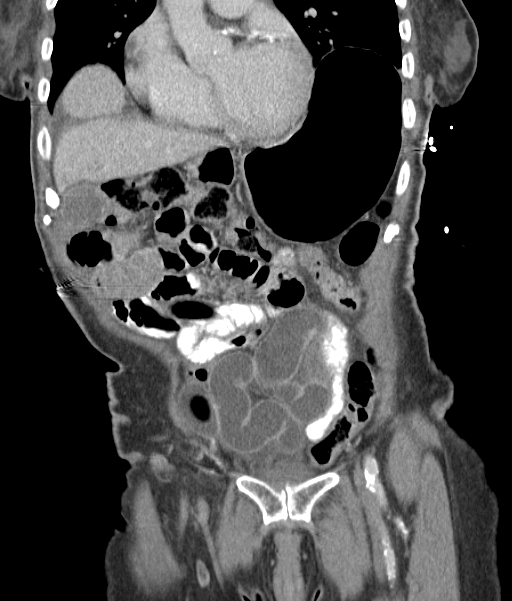
[im 40/89  soft-tissue]
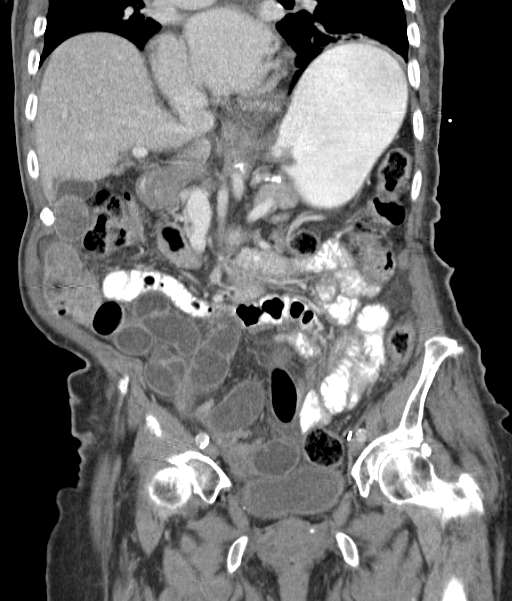
[im 49/89  soft-tissue]
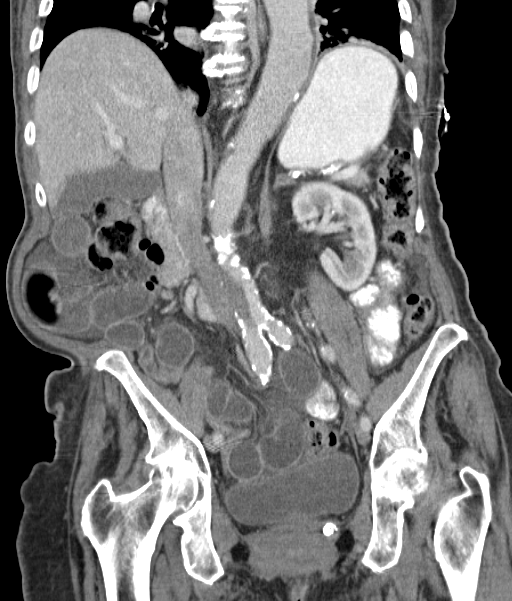

[15 of 46 positions shown; findings below may reference images not displayed]

FINDINGS: Prominent lung markings at the lung bases suggestive for atelectasis
or scarring. Negative for pleural effusions. No evidence for free
intraperitoneal air.

Low-attenuation fluid in the subcarinal region could be related to
the pericardial recess. Atherosclerotic calcifications in the
coronary arteries. No significant pericardial effusion.

There is a trace amount of perihepatic fluid near the anterior dome.
Gallbladder is mildly distended. Portal venous system is patent.
Normal appearance of the spleen and pancreas. There is trace fluid
along the left hemidiaphragm and adjacent to the spleen.
Indeterminate 1.6 cm right adrenal nodule. Normal appearance the
left adrenal gland. Mildly dense round structure along the right
kidney upper pole (measures 1.5 cm) is indeterminate based on the
Hounsfield units (31). There are additional small hypodense
structures throughout the right kidney that probably represent
cysts. Probable small cysts in the left kidney. No evidence for
hydronephrosis.

Uterus appears to be surgically absent. Moderate amount of fluid in
the urinary bladder. Trace free fluid in the pelvis. Small amount of
free fluid in the left paracolic gutter. There is free fluid and
mesenteric edema in the right lower abdomen. Dilated loops of small
bowel in the mid and right abdomen. The terminal ileum and appendix
appear to be normal. Dilated small bowel loops measuring up to
cm.

No significant lymphadenopathy. Diffuse atherosclerotic
calcifications involving the arterial structures. The abdominal
aorta at the hiatus measures 3.1 cm. Large amount of mural thrombus
along the right side of the infrarenal abdominal aorta. The
infrarenal abdominal aorta is ectatic measuring up to 2.8 cm.

Severe multilevel degenerative disease in the lumbar spine.
Anterolisthesis at L4-L5.
IMPRESSION: Dilated loops of small bowel with abdominal ascites. The distal
small bowel is decompressed. Findings are suggestive for a small
bowel obstruction. A transition point is not clearly identified.

Several low density structures in the kidneys are suggestive for
cysts. Largest in the right kidney upper pole is indeterminate based
on the Hounsfield units.

1.6 cm right adrenal nodule is indeterminate. Favor a benign
etiology based on the size.

Diffuse atherosclerotic disease involving the aorta and coronary
arteries.

These results were called by telephone at the time of interpretation
on 10/13/2014 at [DATE] to Dr. TORITO SCHIFFMAN , who verbally
acknowledged these results.

## 2016-11-27 IMAGING — CR DG CHEST 1V PORT
1 series · 2 of 2 positions shown · non-contrast
Comparison: Study obtained earlier in the day

CLINICAL DATA: Nasogastric tube replacement

EXAM:
PORTABLE CHEST - 1 VIEW

[Series 1: ap portable · 0.17mm/px · 2 of 2 slices shown]
[im 1/2]
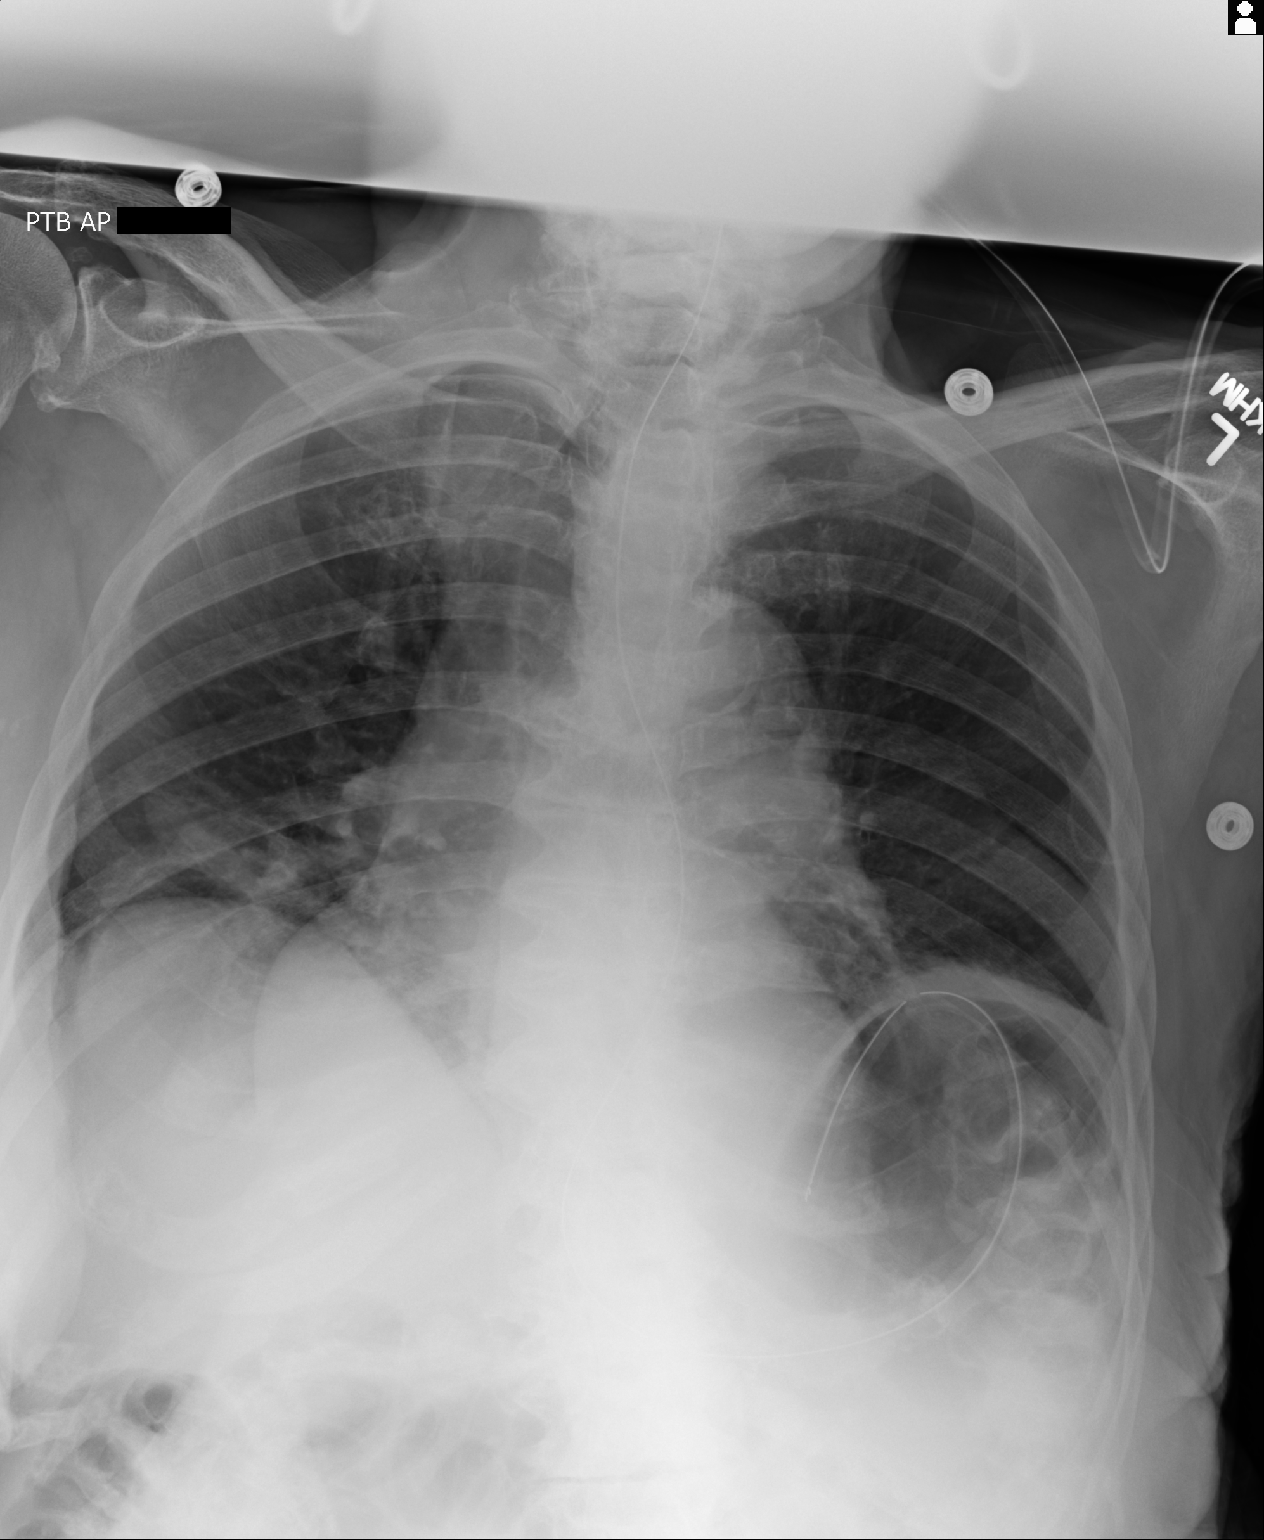
[im 2/2]
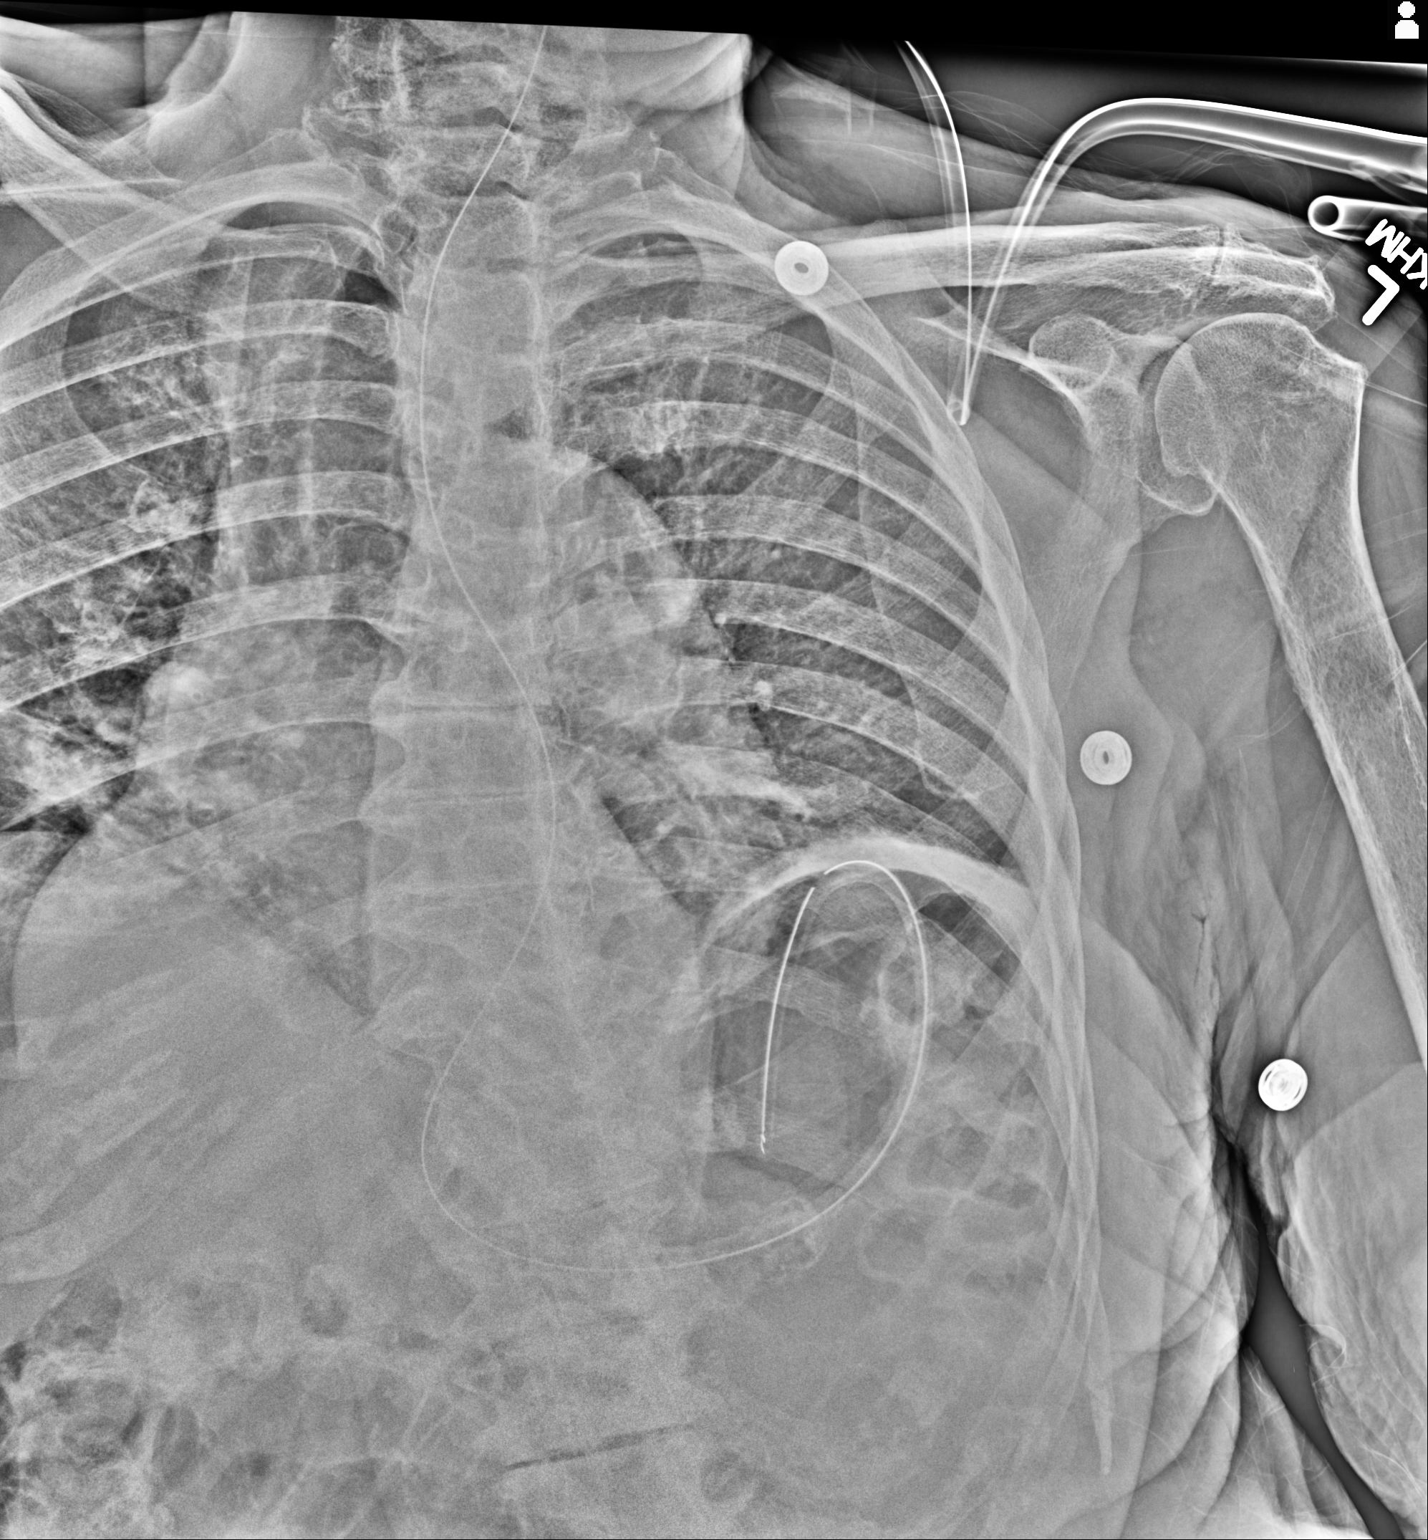

[2 of 2 positions shown; findings below may reference images not displayed]

FINDINGS: Nasogastric tube tip and side port are in the stomach. There is
patchy airspace consolidation in the right base. There is minimal
atelectasis in the left base. Lungs elsewhere clear. Heart is
prominent but stable. Pulmonary vascularity is normal. No
adenopathy.
IMPRESSION: Nasogastric tube tip and side port in stomach. Patchy infiltrate
right base. Slight atelectasis left base. Heart prominent but
stable.

## 2019-03-21 DEATH — deceased
